# Patient Record
Sex: Male | Born: 1960 | Race: Black or African American | Hispanic: No | Marital: Married | Smoking: Current every day smoker
Health system: Southern US, Community
[De-identification: ages and names within clinical notes are randomized; demographics above are authoritative.]

## PROBLEM LIST (undated history)

## (undated) DIAGNOSIS — K759 Inflammatory liver disease, unspecified: Secondary | ICD-10-CM

## (undated) DIAGNOSIS — M545 Low back pain, unspecified: Secondary | ICD-10-CM

## (undated) DIAGNOSIS — E119 Type 2 diabetes mellitus without complications: Secondary | ICD-10-CM

## (undated) DIAGNOSIS — G709 Myoneural disorder, unspecified: Secondary | ICD-10-CM

## (undated) DIAGNOSIS — M1990 Unspecified osteoarthritis, unspecified site: Secondary | ICD-10-CM

## (undated) DIAGNOSIS — K219 Gastro-esophageal reflux disease without esophagitis: Secondary | ICD-10-CM

## (undated) DIAGNOSIS — F419 Anxiety disorder, unspecified: Secondary | ICD-10-CM

## (undated) DIAGNOSIS — F32A Depression, unspecified: Secondary | ICD-10-CM

## (undated) DIAGNOSIS — F1011 Alcohol abuse, in remission: Secondary | ICD-10-CM

## (undated) DIAGNOSIS — F329 Major depressive disorder, single episode, unspecified: Secondary | ICD-10-CM

## (undated) HISTORY — PX: DIAGNOSTIC LAPAROSCOPY: SUR761

## (undated) HISTORY — PX: HEPATITIS C VIRUS RNA BY BRANCHED DNA ASSAY: LAB15016

## (undated) HISTORY — PX: SKIN GRAFT: SHX250

## (undated) HISTORY — PX: LEG SURGERY: SHX1003

---

## 2004-10-06 ENCOUNTER — Emergency Department: Payer: Self-pay | Admitting: Emergency Medicine

## 2005-03-11 ENCOUNTER — Emergency Department: Payer: Self-pay | Admitting: Emergency Medicine

## 2005-04-10 ENCOUNTER — Emergency Department: Payer: Self-pay | Admitting: Emergency Medicine

## 2005-08-31 ENCOUNTER — Emergency Department: Payer: Self-pay | Admitting: Emergency Medicine

## 2005-09-01 ENCOUNTER — Emergency Department: Payer: Self-pay | Admitting: Emergency Medicine

## 2005-09-02 ENCOUNTER — Emergency Department: Payer: Self-pay | Admitting: Emergency Medicine

## 2005-11-23 ENCOUNTER — Emergency Department: Payer: Self-pay | Admitting: General Practice

## 2006-10-12 ENCOUNTER — Emergency Department: Payer: Self-pay | Admitting: General Practice

## 2006-10-24 ENCOUNTER — Emergency Department: Payer: Self-pay | Admitting: Emergency Medicine

## 2007-04-24 ENCOUNTER — Emergency Department: Payer: Self-pay | Admitting: General Practice

## 2008-04-10 ENCOUNTER — Emergency Department: Payer: Self-pay | Admitting: Internal Medicine

## 2008-05-17 ENCOUNTER — Emergency Department: Payer: Self-pay | Admitting: Emergency Medicine

## 2008-10-19 ENCOUNTER — Emergency Department: Payer: Self-pay | Admitting: Emergency Medicine

## 2009-01-13 ENCOUNTER — Emergency Department: Payer: Self-pay | Admitting: Emergency Medicine

## 2009-11-11 ENCOUNTER — Emergency Department: Payer: Self-pay | Admitting: Emergency Medicine

## 2010-11-20 ENCOUNTER — Emergency Department: Payer: Self-pay | Admitting: Emergency Medicine

## 2011-03-22 ENCOUNTER — Emergency Department: Payer: Self-pay | Admitting: Emergency Medicine

## 2013-01-01 ENCOUNTER — Emergency Department: Payer: Self-pay | Admitting: Emergency Medicine

## 2016-05-14 ENCOUNTER — Other Ambulatory Visit: Payer: Self-pay | Admitting: Unknown Physician Specialty

## 2016-05-14 DIAGNOSIS — B1920 Unspecified viral hepatitis C without hepatic coma: Secondary | ICD-10-CM

## 2016-05-15 ENCOUNTER — Ambulatory Visit
Admission: RE | Admit: 2016-05-15 | Discharge: 2016-05-15 | Disposition: A | Discharge status: Home / Self Care | Payer: Medicaid Other | Source: Ambulatory Visit | Attending: Unknown Physician Specialty | Admitting: Unknown Physician Specialty

## 2016-05-22 ENCOUNTER — Ambulatory Visit
Admission: RE | Admit: 2016-05-22 | Discharge: 2016-05-22 | Disposition: A | Discharge status: Home / Self Care | Payer: Medicaid Other | Source: Ambulatory Visit | Attending: Unknown Physician Specialty | Admitting: Unknown Physician Specialty

## 2016-05-22 DIAGNOSIS — R5381 Other malaise: Secondary | ICD-10-CM | POA: Diagnosis not present

## 2016-05-22 DIAGNOSIS — F1020 Alcohol dependence, uncomplicated: Secondary | ICD-10-CM | POA: Diagnosis not present

## 2016-05-22 DIAGNOSIS — Z1211 Encounter for screening for malignant neoplasm of colon: Secondary | ICD-10-CM | POA: Diagnosis not present

## 2016-05-22 DIAGNOSIS — R634 Abnormal weight loss: Secondary | ICD-10-CM | POA: Diagnosis not present

## 2016-05-22 DIAGNOSIS — B1920 Unspecified viral hepatitis C without hepatic coma: Secondary | ICD-10-CM | POA: Insufficient documentation

## 2016-05-22 DIAGNOSIS — R5383 Other fatigue: Secondary | ICD-10-CM | POA: Diagnosis not present

## 2016-06-12 ENCOUNTER — Emergency Department
Admission: EM | Admit: 2016-06-12 | Discharge: 2016-06-12 | Disposition: A | Discharge status: Home / Self Care | Payer: Medicaid Other | Attending: Emergency Medicine | Admitting: Emergency Medicine

## 2016-06-12 ENCOUNTER — Encounter: Payer: Self-pay | Admitting: Medical Oncology

## 2016-06-12 DIAGNOSIS — Y999 Unspecified external cause status: Secondary | ICD-10-CM | POA: Diagnosis not present

## 2016-06-12 DIAGNOSIS — Y939 Activity, unspecified: Secondary | ICD-10-CM | POA: Insufficient documentation

## 2016-06-12 DIAGNOSIS — S61259A Open bite of unspecified finger without damage to nail, initial encounter: Secondary | ICD-10-CM

## 2016-06-12 DIAGNOSIS — Y929 Unspecified place or not applicable: Secondary | ICD-10-CM | POA: Diagnosis not present

## 2016-06-12 DIAGNOSIS — W5581XA Bitten by other mammals, initial encounter: Secondary | ICD-10-CM | POA: Diagnosis not present

## 2016-06-12 DIAGNOSIS — Z23 Encounter for immunization: Secondary | ICD-10-CM | POA: Insufficient documentation

## 2016-06-12 DIAGNOSIS — S61151A Open bite of right thumb with damage to nail, initial encounter: Secondary | ICD-10-CM | POA: Diagnosis present

## 2016-06-12 MED ORDER — TETANUS-DIPHTH-ACELL PERTUSSIS 5-2.5-18.5 LF-MCG/0.5 IM SUSP
0.5000 mL | Freq: Once | INTRAMUSCULAR | Status: AC
  Administered 2016-06-12: 0.5 mL via INTRAMUSCULAR
  Filled 2016-06-12: qty 0.5

## 2016-06-12 MED ORDER — RABIES VACCINE, PCEC IM SUSR
1.0000 mL | Freq: Once | INTRAMUSCULAR | Status: AC
  Administered 2016-06-12: 1 mL via INTRAMUSCULAR
  Filled 2016-06-12: qty 1

## 2016-06-12 MED ORDER — RABIES IMMUNE GLOBULIN 150 UNIT/ML IM INJ
20.0000 [IU]/kg | INJECTION | Freq: Once | INTRAMUSCULAR | Status: AC
  Administered 2016-06-12: 1275 [IU] via INTRAMUSCULAR
  Filled 2016-06-12: qty 8

## 2016-06-12 NOTE — ED Provider Notes (Signed)
[facility] Emergency Department Provider Note   ____________________________________________   None    (approximate)  I have reviewed the triage vital signs and the nursing notes.   HISTORY  Chief Complaint Other (bit by bat)   HPI Miguel Rasmussen is a 55 y.o. male who presents today for evaluation of a bat bite which occurred yesterday. Patient states that he reached on top of his car when he felt something bite him and he instinctively grabbed the bat. Patient states that he stated bleeding at the base of his the nail bed of his right thumb. Patient cleaned the wound with alcohol, peroxide, and "squeezed all the blood out of it." Patient denies headache, confusion, memory loss, fever, or chills. He notes a burning sensation throughout his thumb but denies any ROM deficits. He notes 8/10 pain but has not taken any medications to alleviate the pain.   History reviewed. No pertinent past medical history.  There are no active problems to display for this patient.   History reviewed. No pertinent surgical history.  Prior to Admission medications   Not on File    Allergies Bactrim [sulfamethoxazole-trimethoprim] and Sulfa antibiotics  No family history on file.  Social History Social History  Substance Use Topics  . Smoking status: Not on file  . Smokeless tobacco: Not on file  . Alcohol use Not on file    Review of Systems Constitutional: No fever/chills Eyes: No visual changes. Cardiovascular: Denies chest pain. Respiratory: Denies shortness of breath. Gastrointestinal: No abdominal pain.  No nausea, no vomiting.  No diarrhea.  No constipation. Musculoskeletal: Negative for back pain. Denies decrease in strength or range of motion Skin: Negative for rash. Mild erythema to the base of the nail bed of right thumb Neurological: Negative for headaches, focal weakness or numbness. ____________________________________________   PHYSICAL  EXAM:  VITAL SIGNS: ED Triage Vitals  Enc Vitals Group     BP 06/12/16 1200 99/71     Pulse Rate 06/12/16 1200 82     Resp 06/12/16 1200 16     Temp 06/12/16 1200 98.6 F (37 C)     Temp Source 06/12/16 1200 Oral     SpO2 06/12/16 1200 98 %     Weight 06/12/16 1200 140 lb (63.5 kg)     Height 06/12/16 1200  (1.727 m)     Head Circumference --      Peak Flow --      Pain Score 06/12/16 1201 8     Pain Loc --      Pain Edu? --      Excl. in GC? --    Constitutional: Alert and oriented. Well appearing and in no acute distress. Eyes: Conjunctivae are normal. PERRL. Head: Atraumatic. Neck: No stridor.  Hematological/Lymphatic/Immunilogical: No cervical lymphadenopathy. Cardiovascular: Normal rate, regular rhythm. Grossly normal heart sounds. Good peripheral circulation. Respiratory: Normal respiratory effort.  No retractions. Lungs CTAB. Gastrointestinal: Soft and nontender. No distention. Musculoskeletal: No lower extremity tenderness nor edema.  No joint effusions. Mild pain to palpation throughout right thumb. No swelling noted. Neurologic:  Normal speech and language. No gross focal neurologic deficits are appreciated. No gait instability. Skin:  Skin is warm and dry. No rash noted. Mild redness to base of right thumb nail bed. No laceration noted Psychiatric: Mood and affect are normal. Speech and behavior are normal.  ____________________________________________   LABS (all labs ordered are listed, but only abnormal results are displayed)  Labs Reviewed - No  data to display ____________________________________________  EKG   ____________________________________________  RADIOLOGY   ____________________________________________   PROCEDURES  Procedure(s) performed: None  Procedures  Critical Care performed: No  ____________________________________________   INITIAL IMPRESSION / ASSESSMENT AND PLAN / ED COURSE  Pertinent labs & imaging results that  were available during my care of the patient were reviewed by me and considered in my medical decision making (see chart for details).    Clinical Course   Rabies series initiated at this visit. He was given strict return precautions. He is to return Friday for the second injection. He was advised that he should receive that at [facility] urgent care or return here. He was advised to return to the emergency department for symptoms of concern.  ____________________________________________   FINAL CLINICAL IMPRESSION(S) / ED DIAGNOSES  Final diagnoses:  Bat bite of finger, initial encounter      NEW MEDICATIONS STARTED DURING THIS VISIT:  New Prescriptions   No medications on file     Note:  This document was prepared using Dragon voice recognition software and may include unintentional dictation errors.    Chinita PesterCari B Nethaniel Mattie, FNP 06/12/16 1431    Governor Rooksebecca Lord, MD 06/12/16 [phone]

## 2016-06-12 NOTE — ED Triage Notes (Signed)
Pt reports he was bit by a bat to his rt thumb yesterday. Pt also reports rt sided lower back pain. Last tetanus was 5-6 years ago.

## 2016-06-12 NOTE — ED Notes (Signed)
See triage note  States he was bitten by bat yesterday to right thumb   States bat was on truck and he went to move the bat

## 2016-06-19 ENCOUNTER — Emergency Department
Admission: EM | Admit: 2016-06-19 | Discharge: 2016-06-19 | Disposition: A | Discharge status: Home / Self Care | Payer: Medicaid Other | Attending: Emergency Medicine | Admitting: Emergency Medicine

## 2016-06-19 DIAGNOSIS — Z23 Encounter for immunization: Secondary | ICD-10-CM

## 2016-06-19 DIAGNOSIS — F17200 Nicotine dependence, unspecified, uncomplicated: Secondary | ICD-10-CM | POA: Diagnosis not present

## 2016-06-19 DIAGNOSIS — Z203 Contact with and (suspected) exposure to rabies: Secondary | ICD-10-CM | POA: Insufficient documentation

## 2016-06-19 MED ORDER — RABIES VACCINE, PCEC IM SUSR
1.0000 mL | Freq: Once | INTRAMUSCULAR | Status: AC
  Administered 2016-06-19: 1 mL via INTRAMUSCULAR

## 2016-06-19 MED ORDER — RABIES VACCINE, PCEC IM SUSR
INTRAMUSCULAR | Status: AC
  Filled 2016-06-19: qty 1

## 2016-06-19 NOTE — Discharge Instructions (Signed)
Please return in 1 week and then the following week August 22 and 29th.

## 2016-06-19 NOTE — ED Provider Notes (Signed)
[facility] Emergency Department Provider Note  ____________________________________________  Time seen: Approximately 2:55 PM  I have reviewed the triage vital signs and the nursing notes.   HISTORY  Chief Complaint Rabies Injection    HPI Miguel Rasmussen is a [age] y.o. male who presents for evaluation of rabies vaccination. Patient states he is here for second and the series of vaccinations. He missed his scheduled due date and would like to pick up or he left off.   History reviewed. No pertinent past medical history.  There are no active problems to display for this patient.   History reviewed. No pertinent surgical history.  Prior to Admission medications   Not on File    Allergies Bactrim [sulfamethoxazole-trimethoprim] and Sulfa antibiotics  No family history on file.  Social History Social History  Substance Use Topics  . Smoking status: Current Every Day Smoker  . Smokeless tobacco: Never Used  . Alcohol use No    Review of Systems Constitutional: No fever/chills Eyes: No visual changes. ENT: No sore throat. Cardiovascular: Denies chest pain. Respiratory: Denies shortness of breath. Gastrointestinal: No abdominal pain.  No nausea, no vomiting.  No diarrhea.  No constipation. Genitourinary: Negative for dysuria. Musculoskeletal: Negative for back pain. Skin: Negative for rash. Neurological: Negative for headaches, focal weakness or numbness.  10-point ROS otherwise negative.  ____________________________________________   PHYSICAL EXAM:  VITAL SIGNS: ED Triage Vitals [06/19/16 1428]  Enc Vitals Group     BP 124/83     Pulse Rate 73     Resp 18     Temp 98.9 F (37.2 C)     Temp Source Oral     SpO2 96 %     Weight      Height 5\' 8"  (1.727 m)     Head Circumference      Peak Flow      Pain Score      Pain Loc      Pain Edu?      Excl. in GC?     Constitutional: Alert and oriented. Well appearing and in no  acute distress. Eyes: Conjunctivae are normal. PERRL. EOMI. Head: Atraumatic. Nose: No congestion/rhinnorhea. Mouth/Throat: Mucous membranes are moist.  Oropharynx non-erythematous. Neck: No stridor.   Cardiovascular: Normal rate, regular rhythm. Grossly normal heart sounds.  Good peripheral circulation. Respiratory: Normal respiratory effort.  No retractions. Lungs CTAB. Musculoskeletal: No lower extremity tenderness nor edema.  No joint effusions. Neurologic:  Normal speech and language. No gross focal neurologic deficits are appreciated. No gait instability. Skin:  Skin is warm, dry and intact. No rash noted. Psychiatric: Mood and affect are normal. Speech and behavior are normal.  ____________________________________________   LABS (all labs ordered are listed, but only abnormal results are displayed)  Labs Reviewed - No data to display ____________________________________________  EKG   ____________________________________________  RADIOLOGY   ____________________________________________   PROCEDURES  Procedure(s) performed: None  Critical Care performed: No  ____________________________________________   INITIAL IMPRESSION / ASSESSMENT AND PLAN / ED COURSE  Pertinent labs & imaging results that were available during my care of the patient were reviewed by me and considered in my medical decision making (see chart for details). Review of the [place] CSRS was performed in accordance of the [facility] prior to dispensing any controlled drugs.  His vaccination section injection. Patient follow-up on days #14 and 21 now.  Clinical Course    ____________________________________________   FINAL CLINICAL IMPRESSION(S) / ED DIAGNOSES  Final diagnoses:  Need  for rabies vaccination     This chart was dictated using voice recognition software/Dragon. Despite best efforts to proofread, errors can occur which can change the meaning. Any change was purely unintentional.     Evangeline Dakinharles M Shariece Viveiros, PA-C 06/19/16 1500    Governor Rooksebecca Lord, MD 06/19/16 [phone]

## 2016-06-19 NOTE — ED Triage Notes (Signed)
Pt is here for second rabies injection, states he thinks he was suppose to be here Friday but did not have a ride.

## 2016-06-26 ENCOUNTER — Emergency Department
Admission: EM | Admit: 2016-06-26 | Discharge: 2016-06-26 | Disposition: A | Discharge status: Home / Self Care | Payer: Medicaid Other | Attending: Emergency Medicine | Admitting: Emergency Medicine

## 2016-06-26 ENCOUNTER — Encounter: Payer: Self-pay | Admitting: Emergency Medicine

## 2016-06-26 DIAGNOSIS — Z203 Contact with and (suspected) exposure to rabies: Secondary | ICD-10-CM | POA: Insufficient documentation

## 2016-06-26 DIAGNOSIS — F17200 Nicotine dependence, unspecified, uncomplicated: Secondary | ICD-10-CM | POA: Insufficient documentation

## 2016-06-26 DIAGNOSIS — Z23 Encounter for immunization: Secondary | ICD-10-CM

## 2016-06-26 MED ORDER — RABIES VACCINE, PCEC IM SUSR
1.0000 mL | Freq: Once | INTRAMUSCULAR | Status: AC
  Administered 2016-06-26: 1 mL via INTRAMUSCULAR
  Filled 2016-06-26: qty 1

## 2016-06-26 NOTE — ED Triage Notes (Signed)
Pt here for third rabies injection.  

## 2016-06-26 NOTE — ED Notes (Signed)
Here for additional rabies vaccine in series

## 2016-06-26 NOTE — ED Provider Notes (Signed)
[facility] Emergency Department Provider Note  ____________________________________________  Time seen: Approximately 4:08 PM  I have reviewed the triage vital signs and the nursing notes.   HISTORY  Chief Complaint Rabies Injection    HPI Miguel Rasmussen is a 55 y.o. male presents for evaluation of rabies vaccination. Patient denies any complaints since previous injection. Voices no other emergency complaints   History reviewed. No pertinent past medical history.  There are no active problems to display for this patient.   No past surgical history on file.  Prior to Admission medications   Not on File    Allergies Bactrim [sulfamethoxazole-trimethoprim] and Sulfa antibiotics  No family history on file.  Social History Social History  Substance Use Topics  . Smoking status: Current Every Day Smoker  . Smokeless tobacco: Never Used  . Alcohol use No    Review of Systems Constitutional: No fever/chills Eyes: No visual changes. ENT: No sore throat. Cardiovascular: Denies chest pain. Respiratory: Denies shortness of breath. Gastrointestinal: No abdominal pain.  No nausea, no vomiting.  No diarrhea.  No constipation. Genitourinary: Negative for dysuria. Musculoskeletal: Negative for back pain. Skin: Negative for rash. Neurological: Negative for headaches, focal weakness or numbness.  10-point ROS otherwise negative.  ____________________________________________   PHYSICAL EXAM:  VITAL SIGNS: ED Triage Vitals [06/26/16 1517]  Enc Vitals Group     BP 138/73     Pulse Rate 71     Resp 16     Temp 98.5 F (36.9 C)     Temp Source Oral     SpO2 99 %     Weight 145 lb (65.8 kg)     Height 5\' 8"  (1.727 m)     Head Circumference      Peak Flow      Pain Score      Pain Loc      Pain Edu?      Excl. in GC?     Constitutional: Alert and oriented. Well appearing and in no acute distress.  Cardiovascular: Normal rate, regular  rhythm. Grossly normal heart sounds.  Good peripheral circulation. Respiratory: Normal respiratory effort.  No retractions. Lungs CTAB. Neurologic:  Normal speech and language. No gross focal neurologic deficits are appreciated. No gait instability. Skin:  Skin is warm, dry and intact. No rash noted. Psychiatric: Mood and affect are normal. Speech and behavior are normal.  ____________________________________________   LABS (all labs ordered are listed, but only abnormal results are displayed)  Labs Reviewed - No data to display ____________________________________________  EKG   ____________________________________________  RADIOLOGY   ____________________________________________   PROCEDURES  Procedure(s) performed: None  Critical Care performed: No  ____________________________________________   INITIAL IMPRESSION / ASSESSMENT AND PLAN / ED COURSE  Pertinent labs & imaging results that were available during my care of the patient were reviewed by me and considered in my medical decision making (see chart for details). Review of the [place] CSRS was performed in accordance of the [facility] prior to dispensing any controlled drugs.  Rabies vaccination. Patient return in 1 week for final and the series. He voices no other emergency medical complaints at this time.  Clinical Course    ____________________________________________   FINAL CLINICAL IMPRESSION(S) / ED DIAGNOSES  Final diagnoses:  Need for rabies vaccination     This chart was dictated using voice recognition software/Dragon. Despite best efforts to proofread, errors can occur which can change the meaning. Any change was purely unintentional.    Charmayne Sheerharles M  Beers, PA-C 06/26/16 1613    Loleta Roseory Forbach, MD 06/26/16 [phone]

## 2016-06-29 ENCOUNTER — Encounter: Payer: Self-pay | Admitting: *Deleted

## 2016-07-02 ENCOUNTER — Ambulatory Visit
Admission: RE | Admit: 2016-07-02 | Discharge: 2016-07-02 | Disposition: A | Discharge status: Home / Self Care | Payer: Medicaid Other | Source: Ambulatory Visit | Attending: Unknown Physician Specialty | Admitting: Unknown Physician Specialty

## 2016-07-02 ENCOUNTER — Encounter
Admission: RE | Disposition: A | Discharge status: Home / Self Care | Payer: Self-pay | Source: Ambulatory Visit | Attending: Unknown Physician Specialty

## 2016-07-02 HISTORY — DX: Inflammatory liver disease, unspecified: K75.9

## 2016-07-02 SURGERY — COLONOSCOPY WITH PROPOFOL
Anesthesia: General

## 2016-07-03 ENCOUNTER — Encounter: Payer: Self-pay | Admitting: Emergency Medicine

## 2016-07-03 ENCOUNTER — Emergency Department
Admission: EM | Admit: 2016-07-03 | Discharge: 2016-07-03 | Discharge status: Against Medical Advice | Payer: Medicaid Other | Attending: Student | Admitting: Student

## 2016-07-03 DIAGNOSIS — F17210 Nicotine dependence, cigarettes, uncomplicated: Secondary | ICD-10-CM | POA: Diagnosis not present

## 2016-07-03 DIAGNOSIS — Z23 Encounter for immunization: Secondary | ICD-10-CM

## 2016-07-03 MED ORDER — RABIES VACCINE, PCEC IM SUSR
1.0000 mL | Freq: Once | INTRAMUSCULAR | Status: AC
  Administered 2016-07-03: 1 mL via INTRAMUSCULAR
  Filled 2016-07-03: qty 1

## 2016-07-03 NOTE — ED Provider Notes (Signed)
[facility] Emergency Department Provider Note  ____________________________________________  Time seen: Approximately 11:39 AM  I have reviewed the triage vital signs and the nursing notes.   HISTORY  Chief Complaint Rabies Injection    HPI Miguel Rasmussen is a 55 y.o. male , NAD, presents to emergency department for fourth and final rabies vaccination. Has had no adverse events with previous vaccinations and has no other complaints to be evaluated.   Past Medical History:  Diagnosis Date  . Hepatitis    hep c without hepatic coma    There are no active problems to display for this patient.   History reviewed. No pertinent surgical history.  Prior to Admission medications   Medication Sig Start Date End Date Taking? Authorizing Provider  Ledipasvir-Sofosbuvir (HARVONI) 90-400 MG TABS Take by mouth.    Historical Provider, MD    Allergies Bactrim [sulfamethoxazole-trimethoprim] and Sulfa antibiotics  No family history on file.  Social History Social History  Substance Use Topics  . Smoking status: Current Every Day Smoker    Types: Cigarettes  . Smokeless tobacco: Never Used  . Alcohol use Yes     Comment: every day      Review of Systems  Patient left emergency department before full ROS could be completed. Patient was visualized without distress, in no pain with a steady gait.  ____________________________________________   PHYSICAL EXAM:  VITAL SIGNS: ED Triage Vitals [07/03/16 1051]  Enc Vitals Group     BP 133/81     Pulse Rate 73     Resp 18     Temp 98.4 F (36.9 C)     Temp Source Oral     SpO2 99 %     Weight      Height      Head Circumference      Peak Flow      Pain Score 7     Pain Loc      Pain Edu?      Excl. in GC?      Constitutional: Alert and oriented. Well appearing and in no acute distress. Head: Atraumatic. Respiratory: Normal respiratory effort without tachypnea or retractions.  Neurologic:   Normal speech and language. No gross focal neurologic deficits are appreciated. Gait and posture are normal Skin:  Skin is warm, dry and intact. No rash noted. Psychiatric: Mood and affect are normal. Speech and behavior are normal. Patient exhibits appropriate insight and judgement.   ____________________________________________   LABS  None ____________________________________________  EKG  None ____________________________________________  RADIOLOGY  None ____________________________________________    PROCEDURES  Procedure(s) performed: None   Procedures   Medications  rabies vaccine (RABAVERT) injection 1 mL (1 mL Intramuscular Given 07/03/16 1119)     ____________________________________________   INITIAL IMPRESSION / ASSESSMENT AND PLAN / ED COURSE  Pertinent labs & imaging results that were available during my care of the patient were reviewed by me and considered in my medical decision making (see chart for details).  Clinical Course    Patient's diagnosis is consistent with need for rabies vaccination. Patient left the emergency department before complete review of systems and physical examination could be done. Patient stated "I am on someone else's time" as he walked down the hallway towards the exit. Patient was seen ambulating and leaving the department without distress or difficulty. He was in no pain and tolerated RabAvert injection well without immediate side effects.     ____________________________________________  FINAL CLINICAL IMPRESSION(S) / ED DIAGNOSES  Final diagnoses:  Need for rabies vaccination      NEW MEDICATIONS STARTED DURING THIS VISIT:  New Prescriptions   No medications on file         Hope PigeonJami L Adaleena Mooers, PA-C 07/03/16 1144    Gayla DossEryka A Gayle, MD 07/03/16 [phone]

## 2016-07-03 NOTE — ED Triage Notes (Signed)
Pt to ed for last rabies vaccine.   

## 2016-07-03 NOTE — ED Notes (Addendum)
Pt here for rabies injection, has already had 6 injections previous to today. Bite from bat was 3wks ago. Pt denies any symptoms

## 2016-09-14 ENCOUNTER — Encounter: Payer: Self-pay | Admitting: *Deleted

## 2016-09-17 ENCOUNTER — Ambulatory Visit: Payer: Medicaid Other | Admitting: Anesthesiology

## 2016-09-17 ENCOUNTER — Encounter: Payer: Self-pay | Admitting: Anesthesiology

## 2016-09-17 ENCOUNTER — Ambulatory Visit
Admission: RE | Admit: 2016-09-17 | Discharge: 2016-09-17 | Disposition: A | Discharge status: Home / Self Care | Payer: Medicaid Other | Source: Ambulatory Visit | Attending: Unknown Physician Specialty | Admitting: Unknown Physician Specialty

## 2016-09-17 ENCOUNTER — Encounter
Admission: RE | Disposition: A | Discharge status: Home / Self Care | Payer: Self-pay | Source: Ambulatory Visit | Attending: Unknown Physician Specialty

## 2016-09-17 DIAGNOSIS — F17210 Nicotine dependence, cigarettes, uncomplicated: Secondary | ICD-10-CM | POA: Insufficient documentation

## 2016-09-17 DIAGNOSIS — Z1211 Encounter for screening for malignant neoplasm of colon: Secondary | ICD-10-CM | POA: Diagnosis present

## 2016-09-17 DIAGNOSIS — K621 Rectal polyp: Secondary | ICD-10-CM | POA: Insufficient documentation

## 2016-09-17 DIAGNOSIS — E119 Type 2 diabetes mellitus without complications: Secondary | ICD-10-CM | POA: Diagnosis not present

## 2016-09-17 DIAGNOSIS — R634 Abnormal weight loss: Secondary | ICD-10-CM | POA: Diagnosis not present

## 2016-09-17 DIAGNOSIS — K3189 Other diseases of stomach and duodenum: Secondary | ICD-10-CM | POA: Diagnosis not present

## 2016-09-17 DIAGNOSIS — F329 Major depressive disorder, single episode, unspecified: Secondary | ICD-10-CM | POA: Insufficient documentation

## 2016-09-17 DIAGNOSIS — F419 Anxiety disorder, unspecified: Secondary | ICD-10-CM | POA: Diagnosis not present

## 2016-09-17 DIAGNOSIS — K640 First degree hemorrhoids: Secondary | ICD-10-CM | POA: Insufficient documentation

## 2016-09-17 DIAGNOSIS — B1920 Unspecified viral hepatitis C without hepatic coma: Secondary | ICD-10-CM | POA: Diagnosis not present

## 2016-09-17 DIAGNOSIS — Z882 Allergy status to sulfonamides status: Secondary | ICD-10-CM | POA: Insufficient documentation

## 2016-09-17 HISTORY — PX: COLONOSCOPY: SHX5424

## 2016-09-17 HISTORY — DX: Major depressive disorder, single episode, unspecified: F32.9

## 2016-09-17 HISTORY — DX: Depression, unspecified: F32.A

## 2016-09-17 HISTORY — DX: Type 2 diabetes mellitus without complications: E11.9

## 2016-09-17 HISTORY — DX: Anxiety disorder, unspecified: F41.9

## 2016-09-17 HISTORY — DX: Low back pain, unspecified: M54.50

## 2016-09-17 HISTORY — DX: Low back pain: M54.5

## 2016-09-17 HISTORY — DX: Alcohol abuse, in remission: F10.11

## 2016-09-17 HISTORY — PX: ESOPHAGOGASTRODUODENOSCOPY (EGD) WITH PROPOFOL: SHX5813

## 2016-09-17 LAB — PROTIME-INR
INR: 0.93
Prothrombin Time: 12.5 seconds (ref 11.4–15.2)

## 2016-09-17 LAB — CBC WITH DIFFERENTIAL/PLATELET
BASOS ABS: 0 10*3/uL (ref 0–0.1)
BASOS PCT: 1 %
EOS PCT: 1 %
Eosinophils Absolute: 0.1 10*3/uL (ref 0–0.7)
HCT: 45.6 % (ref 40.0–52.0)
Hemoglobin: 15.1 g/dL (ref 13.0–18.0)
LYMPHS PCT: 28 %
Lymphs Abs: 2 10*3/uL (ref 1.0–3.6)
MCH: 33.1 pg (ref 26.0–34.0)
MCHC: 33.1 g/dL (ref 32.0–36.0)
MCV: 100 fL (ref 80.0–100.0)
MONO ABS: 0.8 10*3/uL (ref 0.2–1.0)
Monocytes Relative: 11 %
Neutro Abs: 4.2 10*3/uL (ref 1.4–6.5)
Neutrophils Relative %: 59 %
PLATELETS: 124 10*3/uL — AB (ref 150–440)
RBC: 4.56 MIL/uL (ref 4.40–5.90)
RDW: 12.3 % (ref 11.5–14.5)
WBC: 7 10*3/uL (ref 3.8–10.6)

## 2016-09-17 SURGERY — ESOPHAGOGASTRODUODENOSCOPY (EGD) WITH PROPOFOL
Anesthesia: General

## 2016-09-17 MED ORDER — SODIUM CHLORIDE 0.9 % IV SOLN
INTRAVENOUS | Status: DC
  Administered 2016-09-17: 11:00:00 via INTRAVENOUS

## 2016-09-17 MED ORDER — GLYCOPYRROLATE 0.2 MG/ML IJ SOLN
INTRAMUSCULAR | Status: DC | PRN
  Administered 2016-09-17: 0.2 mg via INTRAVENOUS

## 2016-09-17 MED ORDER — LIDOCAINE HCL (PF) 2 % IJ SOLN
INTRAMUSCULAR | Status: DC | PRN
Start: 2016-09-17 — End: 2016-09-17
  Administered 2016-09-17: 50 mg

## 2016-09-17 MED ORDER — PROPOFOL 10 MG/ML IV BOLUS
INTRAVENOUS | Status: DC | PRN
  Administered 2016-09-17: 50 mg via INTRAVENOUS

## 2016-09-17 MED ORDER — FENTANYL CITRATE (PF) 100 MCG/2ML IJ SOLN
INTRAMUSCULAR | Status: DC | PRN
  Administered 2016-09-17: 50 ug via INTRAVENOUS

## 2016-09-17 MED ORDER — MIDAZOLAM HCL 5 MG/5ML IJ SOLN
INTRAMUSCULAR | Status: DC | PRN
  Administered 2016-09-17: 2 mg via INTRAVENOUS

## 2016-09-17 MED ORDER — PROPOFOL 500 MG/50ML IV EMUL
INTRAVENOUS | Status: DC | PRN
  Administered 2016-09-17: 100 ug/kg/min via INTRAVENOUS

## 2016-09-17 NOTE — Anesthesia Postprocedure Evaluation (Signed)
Anesthesia Post Note  Patient: Lilyan Puntlvin P Dowland  Procedure(s) Performed: Procedure(s) (LRB): ESOPHAGOGASTRODUODENOSCOPY (EGD) WITH PROPOFOL (N/A) COLONOSCOPY (N/A)  Patient location during evaluation: Endoscopy Anesthesia Type: General Level of consciousness: awake and alert Pain management: pain level controlled Vital Signs Assessment: post-procedure vital signs reviewed and stable Respiratory status: spontaneous breathing, nonlabored ventilation, respiratory function stable and patient connected to nasal cannula oxygen Cardiovascular status: blood pressure returned to baseline and stable Postop Assessment: no signs of nausea or vomiting Anesthetic complications: no    Last Vitals:  Vitals:   09/17/16 1110 09/17/16 1140  BP: 119/88 (!) 152/80  Pulse: 85   Resp: 18   Temp: 36.1 C     Last Pain:  Vitals:   09/17/16 1110  TempSrc: Tympanic                 Tavien Chestnut S

## 2016-09-17 NOTE — Anesthesia Preprocedure Evaluation (Addendum)
Anesthesia Evaluation  Patient identified by MRN, date of birth, ID band Patient awake    Reviewed: Allergy & Precautions, NPO status , Patient's Chart, lab work & pertinent test results, reviewed documented beta blocker date and time   Airway Mallampati: II  TM Distance: >3 FB     Dental  (+) Chipped   Pulmonary Current Smoker,           Cardiovascular      Neuro/Psych PSYCHIATRIC DISORDERS Anxiety Depression    GI/Hepatic (+) Hepatitis -, C  Endo/Other  diabetes, Type 2  Renal/GU      Musculoskeletal   Abdominal   Peds  Hematology   Anesthesia Other Findings ETOH. Will start taking Harvoni in Dec.  Reproductive/Obstetrics                            Anesthesia Physical Anesthesia Plan  ASA: III  Anesthesia Plan: General   Post-op Pain Management:    Induction: Intravenous  Airway Management Planned: Nasal Cannula  Additional Equipment:   Intra-op Plan:   Post-operative Plan:   Informed Consent: I have reviewed the patients History and Physical, chart, labs and discussed the procedure including the risks, benefits and alternatives for the proposed anesthesia with the patient or authorized representative who has indicated his/her understanding and acceptance.     Plan Discussed with: CRNA  Anesthesia Plan Comments:         Anesthesia Quick Evaluation

## 2016-09-17 NOTE — Op Note (Signed)
[facility] Gastroenterology Patient Name: Candie Milelvin Counihan Procedure Date: 09/17/2016 10:33 AM MRN: [id number] Account #: [id number] Date of Birth: 1961-05-09 Admit Type: Outpatient Age: 55 Room: [facility] ENDO ROOM 4 Gender: Male Note Status: Finalized Procedure:            Upper GI endoscopy Indications:          Weight loss Providers:            Scot Junobert T. Lucill Mauck, MD Referring MD:         Oswaldo DoneAbby D. Hessie DienerBender MD, MD (Referring MD) Medicines:            Propofol per Anesthesia Complications:        No immediate complications. Procedure:            Pre-Anesthesia Assessment:                       - After reviewing the risks and benefits, the patient                        was deemed in satisfactory condition to undergo the                        procedure.                       After obtaining informed consent, the endoscope was                        passed under direct vision. Throughout the procedure,                        the patient's blood pressure, pulse, and oxygen                        saturations were monitored continuously. The Endoscope                        was introduced through the mouth, and advanced to the                        second part of duodenum. The upper GI endoscopy was                        accomplished without difficulty. The patient tolerated                        the procedure well. Findings:      The examined esophagus was normal. GEJ 44cm.      Prominent gastric folds were found in the prepyloric region of the       stomach. Biopsies were taken with a cold forceps for histology.      Diffuse minimal inflammation characterized by granularity was found in       the gastric body. Biopsies were taken with a cold forceps for histology.      The examined duodenum was normal. Impression:           - Normal esophagus.                       - Enlarged gastric folds. Biopsied.                       -  Gastritis. Biopsied.                       -  Normal examined duodenum. Recommendation:       - Await pathology results.                       - Perform a colonoscopy as previously scheduled. Scot Junobert T Jaleen Finch, MD 09/17/2016 10:51:07 AM This report has been signed electronically. Number of Addenda: 0 Note Initiated On: 09/17/2016 10:33 AM      [facility]
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
## 2016-09-17 NOTE — Op Note (Signed)
[facility] Gastroenterology Patient Name: Miguel Rasmussen Procedure Date: 09/17/2016 10:33 AM MRN: [id number] Account #: [id number] Date of Birth: 30-Apr-1961 Admit Type: Outpatient Age: [age] [facility] ENDO ROOM 4 Gender: Male Note Status: Finalized Procedure:            Colonoscopy Indications:          Screening for colorectal malignant neoplasm Providers:            Scot Junobert T. Elliott, MD Referring MD:         Oswaldo DoneAbby D. Hessie DienerBender MD, MD (Referring MD) Medicines:            Propofol per Anesthesia Complications:        No immediate complications. Procedure:            Pre-Anesthesia Assessment:                       - After reviewing the risks and benefits, the patient                        was deemed in satisfactory condition to undergo the                        procedure.                       After obtaining informed consent, the colonoscope was                        passed under direct vision. Throughout the procedure,                        the patient's blood pressure, pulse, and oxygen                        saturations were monitored continuously. The                        Colonoscope was introduced through the anus and                        advanced to the the cecum, identified by appendiceal                        orifice and ileocecal valve. The colonoscopy was                        performed without difficulty. The patient tolerated the                        procedure well. The quality of the bowel preparation                        was adequate to identify polyps. Findings:      A diminutive polyp was found in the rectum. The polyp was sessile. The       polyp was removed with a jumbo cold forceps. Resection and retrieval       were complete.      Internal hemorrhoids were found during endoscopy. The hemorrhoids were       small and Grade I (internal hemorrhoids that do not prolapse).  The exam was otherwise without abnormality. Impression:            - One diminutive polyp in the rectum, removed with a                        jumbo cold forceps. Resected and retrieved.                       - Internal hemorrhoids.                       - The examination was otherwise normal. Recommendation:       - Await pathology results. Scot Junobert T Elliott, MD 09/17/2016 11:07:03 AM This report has been signed electronically. Number of Addenda: 0 Note Initiated On: 09/17/2016 10:33 AM Scope Withdrawal Time: 0 hours 7 minutes 20 seconds  Total Procedure Duration: 0 hours 12 minutes 38 seconds       [facility]

## 2016-09-17 NOTE — Transfer of Care (Signed)
Immediate Anesthesia Transfer of Care Note  Patient: Miguel Rasmussen  Procedure(s) Performed: Procedure(s): ESOPHAGOGASTRODUODENOSCOPY (EGD) WITH PROPOFOL (N/A) COLONOSCOPY (N/A)  Patient Location: PACU  Anesthesia Type:General  Level of Consciousness: sedated  Airway & Oxygen Therapy: Patient Spontanous Breathing and Patient connected to nasal cannula oxygen  Post-op Assessment: Report given to RN and Post -op Vital signs reviewed and stable  Post vital signs: Reviewed and stable  Last Vitals:  Vitals:   09/17/16 0938 09/17/16 1110  BP: (!) 108/58 119/88  Pulse: 85 85  Resp: 18 18  Temp: 36.5 C 36.1 C    Last Pain:  Vitals:   09/17/16 1110  TempSrc: Tympanic         Complications: No apparent anesthesia complications

## 2016-09-18 ENCOUNTER — Encounter: Payer: Self-pay | Admitting: Unknown Physician Specialty

## 2016-09-19 LAB — SURGICAL PATHOLOGY

## 2017-01-24 ENCOUNTER — Other Ambulatory Visit: Payer: Self-pay | Admitting: Gastroenterology

## 2017-01-24 DIAGNOSIS — B182 Chronic viral hepatitis C: Secondary | ICD-10-CM

## 2017-01-31 ENCOUNTER — Ambulatory Visit: Payer: Medicaid Other

## 2017-02-01 ENCOUNTER — Ambulatory Visit
Admission: RE | Admit: 2017-02-01 | Discharge: 2017-02-01 | Disposition: A | Discharge status: Home / Self Care | Payer: Medicaid Other | Source: Ambulatory Visit | Attending: Gastroenterology | Admitting: Gastroenterology

## 2017-02-01 DIAGNOSIS — B182 Chronic viral hepatitis C: Secondary | ICD-10-CM | POA: Insufficient documentation

## 2017-02-01 DIAGNOSIS — R932 Abnormal findings on diagnostic imaging of liver and biliary tract: Secondary | ICD-10-CM | POA: Insufficient documentation

## 2017-02-01 DIAGNOSIS — K7460 Unspecified cirrhosis of liver: Secondary | ICD-10-CM | POA: Insufficient documentation

## 2017-06-28 ENCOUNTER — Other Ambulatory Visit: Payer: Self-pay | Admitting: Gastroenterology

## 2017-06-28 DIAGNOSIS — B182 Chronic viral hepatitis C: Secondary | ICD-10-CM

## 2017-07-11 ENCOUNTER — Emergency Department
Admission: EM | Admit: 2017-07-11 | Discharge: 2017-07-11 | Disposition: A | Discharge status: Home / Self Care | Payer: Medicaid Other | Attending: Emergency Medicine | Admitting: Emergency Medicine

## 2017-07-11 ENCOUNTER — Encounter: Payer: Self-pay | Admitting: Emergency Medicine

## 2017-07-11 ENCOUNTER — Emergency Department: Payer: Medicaid Other

## 2017-07-11 DIAGNOSIS — L0211 Cutaneous abscess of neck: Secondary | ICD-10-CM | POA: Insufficient documentation

## 2017-07-11 DIAGNOSIS — F17210 Nicotine dependence, cigarettes, uncomplicated: Secondary | ICD-10-CM | POA: Insufficient documentation

## 2017-07-11 DIAGNOSIS — R221 Localized swelling, mass and lump, neck: Secondary | ICD-10-CM | POA: Insufficient documentation

## 2017-07-11 DIAGNOSIS — L0291 Cutaneous abscess, unspecified: Secondary | ICD-10-CM

## 2017-07-11 DIAGNOSIS — Z8619 Personal history of other infectious and parasitic diseases: Secondary | ICD-10-CM | POA: Diagnosis not present

## 2017-07-11 DIAGNOSIS — R6884 Jaw pain: Secondary | ICD-10-CM | POA: Diagnosis present

## 2017-07-11 DIAGNOSIS — E119 Type 2 diabetes mellitus without complications: Secondary | ICD-10-CM | POA: Insufficient documentation

## 2017-07-11 LAB — COMPREHENSIVE METABOLIC PANEL
ALBUMIN: 4 g/dL (ref 3.5–5.0)
ALT: 50 U/L (ref 17–63)
ANION GAP: 9 (ref 5–15)
AST: 74 U/L — ABNORMAL HIGH (ref 15–41)
Alkaline Phosphatase: 62 U/L (ref 38–126)
BUN: 13 mg/dL (ref 6–20)
CHLORIDE: 101 mmol/L (ref 101–111)
CO2: 28 mmol/L (ref 22–32)
Calcium: 9.4 mg/dL (ref 8.9–10.3)
Creatinine, Ser: 1.02 mg/dL (ref 0.61–1.24)
GFR calc Af Amer: >60 mL/min (ref >60)
GLUCOSE: 149 mg/dL — AB (ref 65–99)
POTASSIUM: 3.5 mmol/L (ref 3.5–5.1)
SODIUM: 138 mmol/L (ref 135–145)
Total Bilirubin: 0.8 mg/dL (ref 0.3–1.2)
Total Protein: 7.6 g/dL (ref 6.5–8.1)

## 2017-07-11 LAB — CBC WITH DIFFERENTIAL/PLATELET
BASOS ABS: 0 10*3/uL (ref 0–0.1)
Basophils Relative: 1 %
EOS PCT: 0 %
Eosinophils Absolute: 0 10*3/uL (ref 0–0.7)
HEMATOCRIT: 43.1 % (ref 40.0–52.0)
Hemoglobin: 14.5 g/dL (ref 13.0–18.0)
LYMPHS ABS: 1.9 10*3/uL (ref 1.0–3.6)
LYMPHS PCT: 23 %
MCH: 33.4 pg (ref 26.0–34.0)
MCHC: 33.7 g/dL (ref 32.0–36.0)
MCV: 99.2 fL (ref 80.0–100.0)
Monocytes Absolute: 0.7 10*3/uL (ref 0.2–1.0)
Monocytes Relative: 8 %
NEUTROS ABS: 5.7 10*3/uL (ref 1.4–6.5)
Neutrophils Relative %: 68 %
PLATELETS: 131 10*3/uL — AB (ref 150–440)
RBC: 4.34 MIL/uL — AB (ref 4.40–5.90)
RDW: 12.2 % (ref 11.5–14.5)
WBC: 8.3 10*3/uL (ref 3.8–10.6)

## 2017-07-11 MED ORDER — IOPAMIDOL (ISOVUE-300) INJECTION 61%
75.0000 mL | Freq: Once | INTRAVENOUS | Status: AC | PRN
  Administered 2017-07-11: 75 mL via INTRAVENOUS
  Filled 2017-07-11: qty 75

## 2017-07-11 MED ORDER — CLINDAMYCIN HCL 300 MG PO CAPS: 300.0000 mg | ORAL_CAPSULE | Freq: Three times a day (TID) | ORAL | 0 refills | Status: AC

## 2017-07-11 MED ORDER — LIDOCAINE HCL (PF) 1 % IJ SOLN
INTRAMUSCULAR | Status: AC
  Administered 2017-07-11: 5 mL
  Filled 2017-07-11: qty 5

## 2017-07-11 MED ORDER — CLINDAMYCIN HCL 150 MG PO CAPS
ORAL_CAPSULE | ORAL | Status: AC
  Administered 2017-07-11: 150 mg
  Filled 2017-07-11: qty 1

## 2017-07-11 MED ORDER — HYDROCODONE-ACETAMINOPHEN 5-325 MG PO TABS: 1.0000 | ORAL_TABLET | ORAL | 0 refills | Status: AC | PRN

## 2017-07-11 NOTE — ED Triage Notes (Signed)
Patient presents to ED via POV from urgent care. Patient was recently put on amoxicillin for what was thought to be an infected lymph node. Patient has finished his antibiotic but the pain remains. Large swollen area noted below left mandible. Patient states he is able to swallow but it is very painful. Patient also reports a loss of 20 pounds in the past month. Tonsils are red but not enlarged at this time.

## 2017-07-11 NOTE — ED Notes (Addendum)
See triage note  Presents with family  States he developed a sore throat about 2 weeks ago  Was placed on antibiotics   States pain was increased with some increased swelling to left side of neck  Throat is red at present  No pus noted  Afebrile on arrival
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
## 2017-07-11 NOTE — Discharge Instructions (Signed)
as we discussed please call the number for ENT today to arrange a follow-up appointment as soon as possible. Please take her clindamycin as prescribed. Return to the emergency department for any increase in swelling, any difficulty breathing or swallowing,fever, or any other symptom personally concerning to yourself.

## 2017-07-19 ENCOUNTER — Ambulatory Visit
Admission: RE | Admit: 2017-07-19 | Discharge: 2017-07-19 | Disposition: A | Discharge status: Home / Self Care | Payer: Medicaid Other | Source: Ambulatory Visit | Attending: Gastroenterology | Admitting: Gastroenterology

## 2017-07-19 DIAGNOSIS — K760 Fatty (change of) liver, not elsewhere classified: Secondary | ICD-10-CM | POA: Diagnosis not present

## 2017-07-19 DIAGNOSIS — B182 Chronic viral hepatitis C: Secondary | ICD-10-CM | POA: Insufficient documentation

## 2017-07-29 ENCOUNTER — Other Ambulatory Visit: Payer: Self-pay | Admitting: Otolaryngology

## 2017-07-29 DIAGNOSIS — R590 Localized enlarged lymph nodes: Secondary | ICD-10-CM

## 2017-07-30 NOTE — OR Nursing (Signed)
Attempted on 9/25 @ 1640 to call patient regarding biopsy scheduled for Friday, not able to reach secondarily to invalid number as well as backup number not allowing message to be left for patient to call back for instructions regarding upcoming procedure.

## 2017-07-31 ENCOUNTER — Other Ambulatory Visit: Payer: Self-pay | Admitting: Radiology

## 2017-08-02 ENCOUNTER — Ambulatory Visit
Admission: RE | Admit: 2017-08-02 | Discharge: 2017-08-02 | Disposition: A | Discharge status: Home / Self Care | Payer: Medicaid Other | Source: Ambulatory Visit | Attending: Otolaryngology | Admitting: Otolaryngology

## 2017-08-02 DIAGNOSIS — M545 Low back pain: Secondary | ICD-10-CM | POA: Insufficient documentation

## 2017-08-02 DIAGNOSIS — Z882 Allergy status to sulfonamides status: Secondary | ICD-10-CM | POA: Insufficient documentation

## 2017-08-02 DIAGNOSIS — E119 Type 2 diabetes mellitus without complications: Secondary | ICD-10-CM | POA: Diagnosis not present

## 2017-08-02 DIAGNOSIS — M1990 Unspecified osteoarthritis, unspecified site: Secondary | ICD-10-CM | POA: Insufficient documentation

## 2017-08-02 DIAGNOSIS — F17210 Nicotine dependence, cigarettes, uncomplicated: Secondary | ICD-10-CM | POA: Diagnosis not present

## 2017-08-02 DIAGNOSIS — F329 Major depressive disorder, single episode, unspecified: Secondary | ICD-10-CM | POA: Diagnosis not present

## 2017-08-02 DIAGNOSIS — R590 Localized enlarged lymph nodes: Secondary | ICD-10-CM | POA: Diagnosis present

## 2017-08-02 DIAGNOSIS — B1920 Unspecified viral hepatitis C without hepatic coma: Secondary | ICD-10-CM | POA: Diagnosis not present

## 2017-08-02 DIAGNOSIS — F419 Anxiety disorder, unspecified: Secondary | ICD-10-CM | POA: Diagnosis not present

## 2017-08-02 DIAGNOSIS — K219 Gastro-esophageal reflux disease without esophagitis: Secondary | ICD-10-CM | POA: Insufficient documentation

## 2017-08-02 HISTORY — DX: Myoneural disorder, unspecified: G70.9

## 2017-08-02 HISTORY — DX: Gastro-esophageal reflux disease without esophagitis: K21.9

## 2017-08-02 HISTORY — DX: Unspecified osteoarthritis, unspecified site: M19.90

## 2017-08-02 LAB — APTT: aPTT: 34 seconds (ref 24–36)

## 2017-08-02 LAB — PROTIME-INR
INR: 0.94
Prothrombin Time: 12.5 seconds (ref 11.4–15.2)

## 2017-08-02 LAB — CBC
HCT: 42.1 % (ref 40.0–52.0)
HEMOGLOBIN: 14 g/dL (ref 13.0–18.0)
MCH: 33.5 pg (ref 26.0–34.0)
MCHC: 33.3 g/dL (ref 32.0–36.0)
MCV: 100.6 fL — ABNORMAL HIGH (ref 80.0–100.0)
Platelets: 116 10*3/uL — ABNORMAL LOW (ref 150–440)
RBC: 4.18 MIL/uL — AB (ref 4.40–5.90)
RDW: 12.8 % (ref 11.5–14.5)
WBC: 8.1 10*3/uL (ref 3.8–10.6)

## 2017-08-02 MED ORDER — SODIUM CHLORIDE 0.9 % IV SOLN: INTRAVENOUS | Status: DC

## 2017-08-02 NOTE — Procedures (Signed)
US guided FNA of neck nodule.  7 FNAs obtained.  Minimal blood loss and no immediate complication.
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
## 2017-08-05 LAB — CYTOLOGY - NON PAP

## 2017-10-17 ENCOUNTER — Encounter: Payer: Self-pay | Admitting: Emergency Medicine

## 2017-10-17 ENCOUNTER — Emergency Department
Admission: EM | Admit: 2017-10-17 | Discharge: 2017-10-17 | Disposition: A | Discharge status: Home / Self Care | Payer: Medicaid Other | Attending: Student in an Organized Health Care Education/Training Program | Admitting: Student in an Organized Health Care Education/Training Program

## 2017-10-17 ENCOUNTER — Other Ambulatory Visit: Payer: Self-pay

## 2017-10-17 ENCOUNTER — Emergency Department: Payer: Medicaid Other

## 2017-10-17 DIAGNOSIS — Y999 Unspecified external cause status: Secondary | ICD-10-CM | POA: Diagnosis not present

## 2017-10-17 DIAGNOSIS — W001XXA Fall from stairs and steps due to ice and snow, initial encounter: Secondary | ICD-10-CM | POA: Diagnosis not present

## 2017-10-17 DIAGNOSIS — E119 Type 2 diabetes mellitus without complications: Secondary | ICD-10-CM | POA: Diagnosis not present

## 2017-10-17 DIAGNOSIS — Y93H1 Activity, digging, shoveling and raking: Secondary | ICD-10-CM | POA: Diagnosis not present

## 2017-10-17 DIAGNOSIS — S62306A Unspecified fracture of fifth metacarpal bone, right hand, initial encounter for closed fracture: Secondary | ICD-10-CM | POA: Diagnosis not present

## 2017-10-17 DIAGNOSIS — S6992XA Unspecified injury of left wrist, hand and finger(s), initial encounter: Secondary | ICD-10-CM | POA: Diagnosis present

## 2017-10-17 DIAGNOSIS — Z79899 Other long term (current) drug therapy: Secondary | ICD-10-CM | POA: Insufficient documentation

## 2017-10-17 DIAGNOSIS — Y92009 Unspecified place in unspecified non-institutional (private) residence as the place of occurrence of the external cause: Secondary | ICD-10-CM | POA: Diagnosis not present

## 2017-10-17 DIAGNOSIS — F17210 Nicotine dependence, cigarettes, uncomplicated: Secondary | ICD-10-CM | POA: Diagnosis not present

## 2017-10-17 MED ORDER — OXYCODONE-ACETAMINOPHEN 5-325 MG PO TABS
1.0000 | ORAL_TABLET | Freq: Once | ORAL | Status: AC
  Administered 2017-10-17: 1 via ORAL
  Filled 2017-10-17: qty 1

## 2017-10-17 MED ORDER — OXYCODONE-ACETAMINOPHEN 5-325 MG PO TABS: 1.0000 | ORAL_TABLET | Freq: Four times a day (QID) | ORAL | 0 refills | Status: AC | PRN

## 2017-10-17 NOTE — ED Triage Notes (Signed)
States he fell yesterday  Injury to right hand  Positive swelling ntoed
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
## 2018-04-12 IMAGING — US US ABDOMEN COMPLETE W/ ELASTOGRAPHY
1 series · 13 of 25 positions shown · non-contrast
Comparison: 05/22/2016

CLINICAL DATA: Chronic hepatitis-C



[Series 1: us abdomen complete w/ elastography · 0.20mm/px · 13 of 97 slices shown]
[im 1/97]
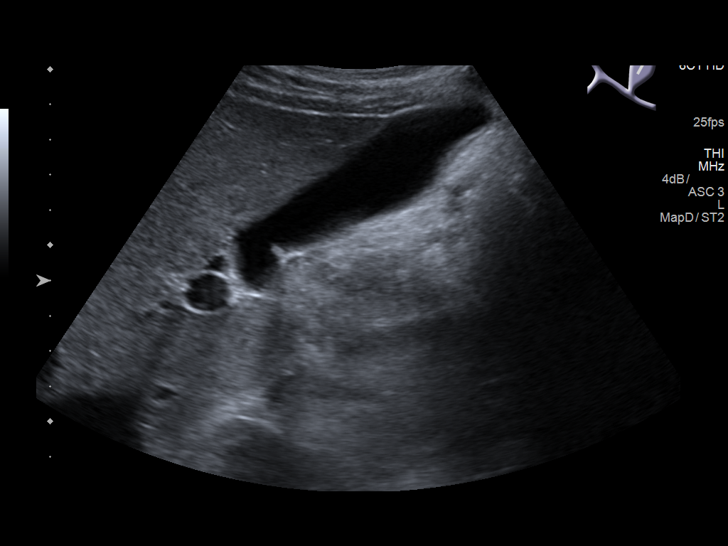
[im 9/97]
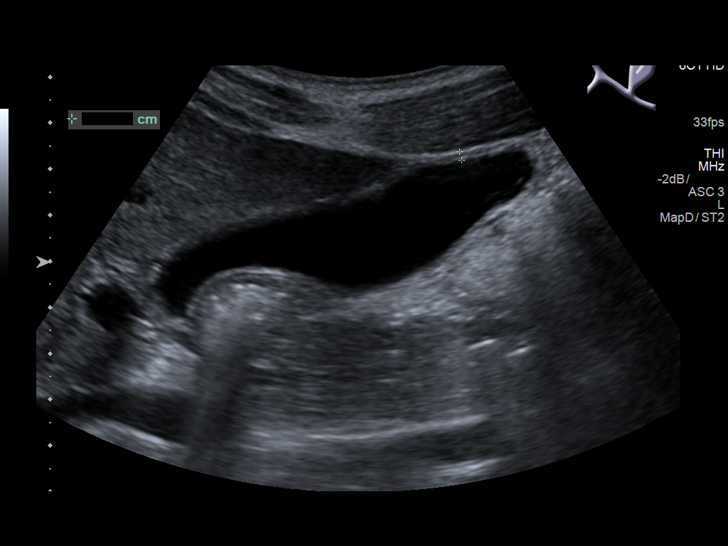
[im 17/97]
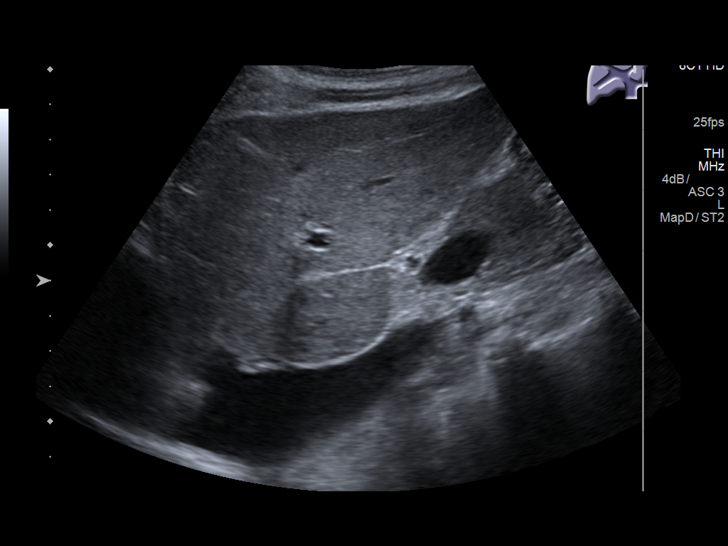
[im 25/97]
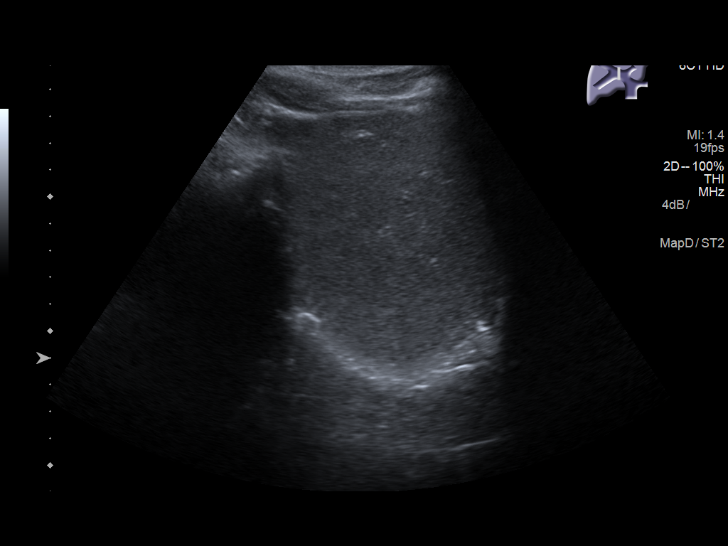
[im 33/97]
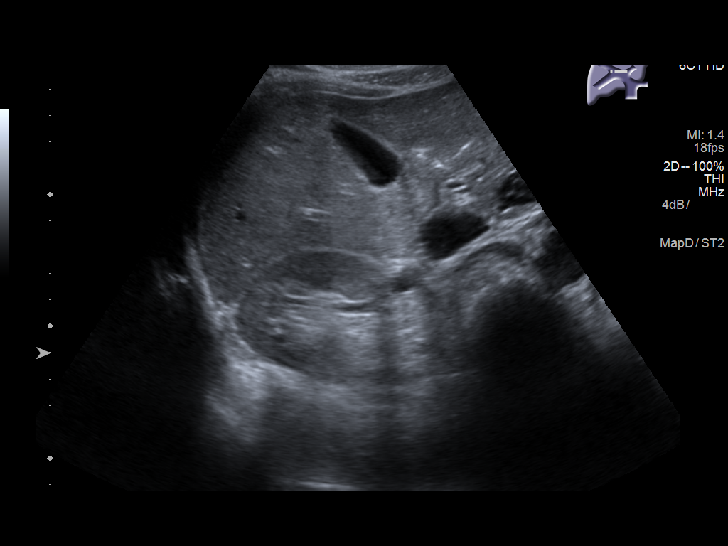
[im 41/97]
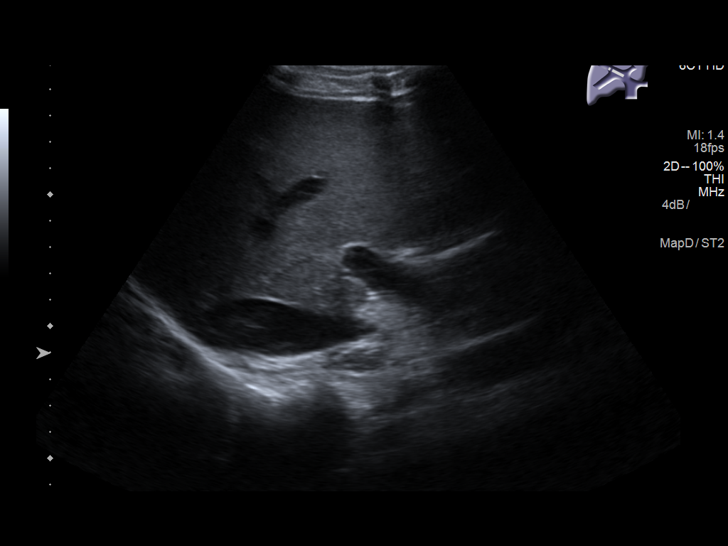
[im 49/97]
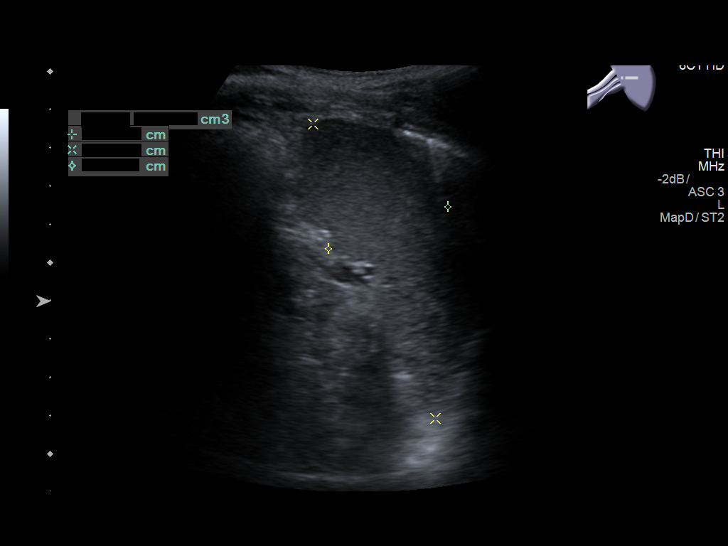
[im 57/97]
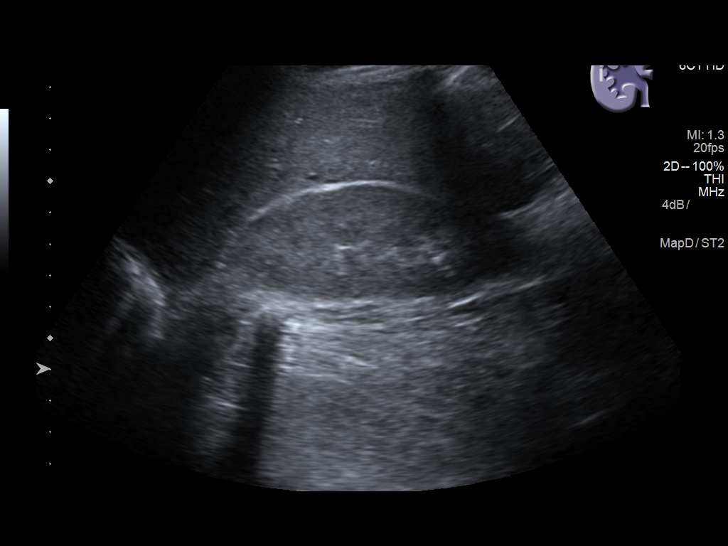
[im 65/97]
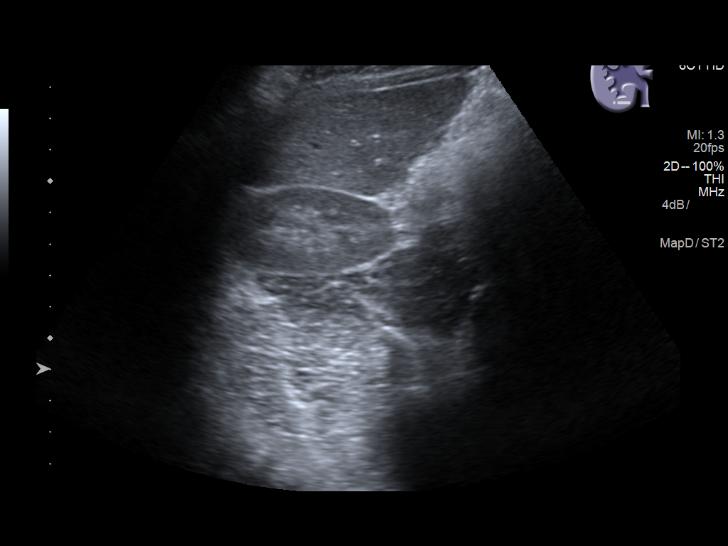
[im 73/97]
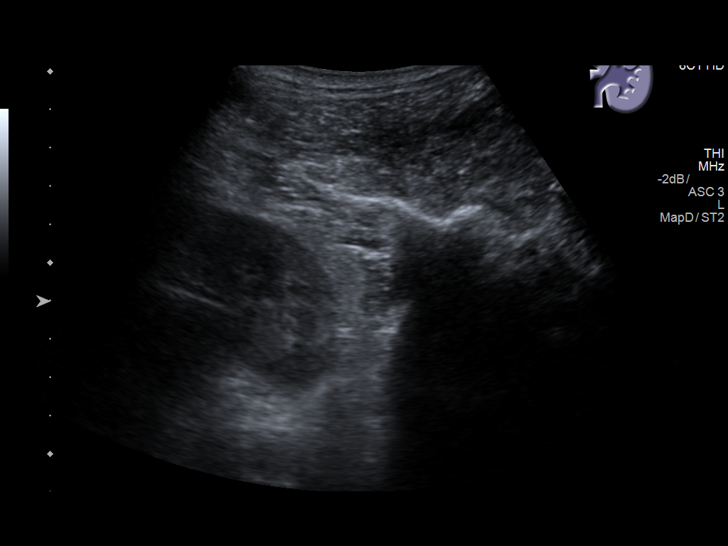
[im 81/97]
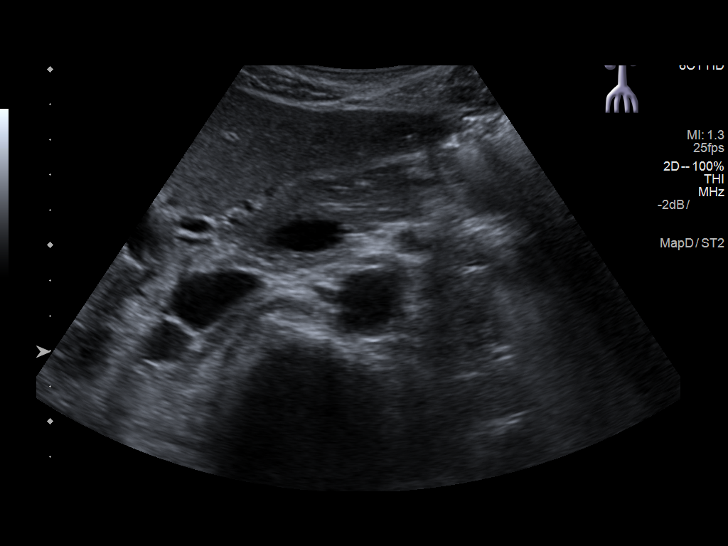
[im 89/97]
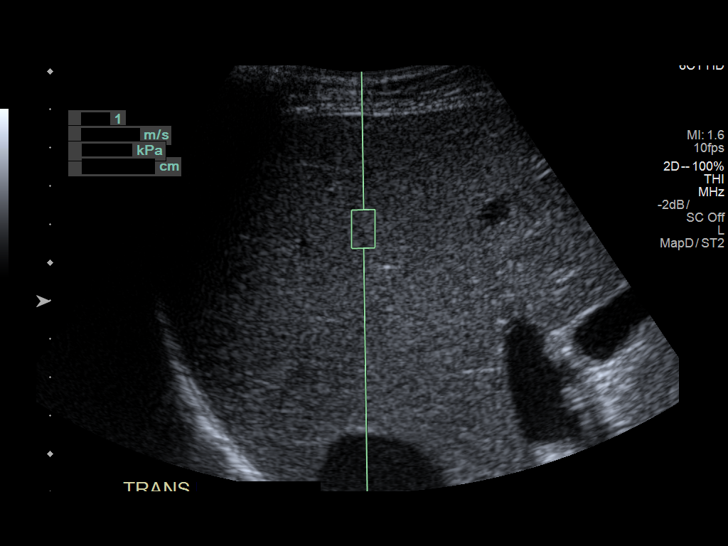
[im 97/97]
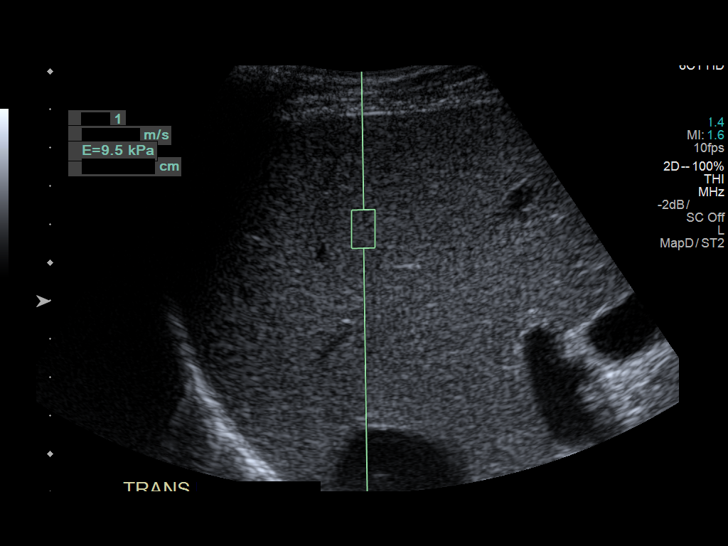

[13 of 25 positions shown; findings below may reference images not displayed]

FINDINGS: ULTRASOUND ABDOMEN

Gallbladder: No gallstones or wall thickening visualized. No
sonographic Murphy sign noted by sonographer.

Common bile duct: Diameter: Normal caliber, 2 mm

Liver: Increased echotexture compatible with fatty infiltration. No
focal abnormality or biliary ductal dilatation. Portal vein is
patent on color Doppler imaging with normal direction of blood flow
towards the liver.

IVC: No abnormality visualized.

Pancreas: Visualized portion unremarkable.

Spleen: Size and appearance within normal limits.

Right Kidney: Length: 10.5 cm. Echogenicity within normal limits. No
mass or hydronephrosis visualized.

Left Kidney: Length: 10.4 cm. Echogenicity within normal limits. No
mass or hydronephrosis visualized.

Abdominal aorta: No aneurysm visualized.

Other findings: None.

ULTRASOUND HEPATIC ELASTOGRAPHY

Device: Siemens Helix VTQ

Patient position: Supine

Transducer 6C1

Number of measurements: 10

Hepatic segment:  8

Median velocity:   1.79  m/sec

IQR:

IQR/Median velocity ratio:

Corresponding Metavir fibrosis score:  F2 + some F3

Risk of fibrosis: Moderate

Limitations of exam: None

Pertinent findings noted on other imaging exams:  None

Please note that abnormal shear wave velocities may also be
identified in clinical settings other than with hepatic fibrosis,
such as: acute hepatitis, elevated right heart and central venous
pressures including use of beta blockers, Amazigh disease
(Databex), infiltrative processes such as
mastocytosis/amyloidosis/infiltrative tumor, extrahepatic
cholestasis, in the post-prandial state, and liver transplantation.
Correlation with patient history, laboratory data, and clinical
condition recommended.
IMPRESSION: ULTRASOUND ABDOMEN:
Fatty infiltration of the liver.

ULTRASOUND HEPATIC ELASTOGRAPHY:

Median hepatic shear wave velocity is calculated at 1.79 m/sec.

Corresponding Metavir fibrosis score is  F2 + some F3.

Risk of fibrosis is Moderate.

Follow-up: Additional testing appropriate

## 2018-04-28 IMAGING — CT CT NECK W/ CM
3 of 5 series · 12 of 34 positions shown, 14 images · IV contrast (iopamidol)
Comparison: None.

CLINICAL DATA: Acute lymphadenitis. Left submandibular soft tissue
swelling despite recent antibiotic therapy. 20 pound weight loss.

EXAM:
CT NECK WITH CONTRAST
TECHNIQUE: Multidetector CT imaging of the neck was performed using the
standard protocol following the bolus administration of intravenous
contrast.
CONTRAST:  75mL ACJRRQ-DEE IOPAMIDOL (ACJRRQ-DEE) INJECTION 61%

[Series 6: sag neck · sagittal · 0.40mm/px · 5 of 115 slices shown, 6 images]
[im 39/115  bone]
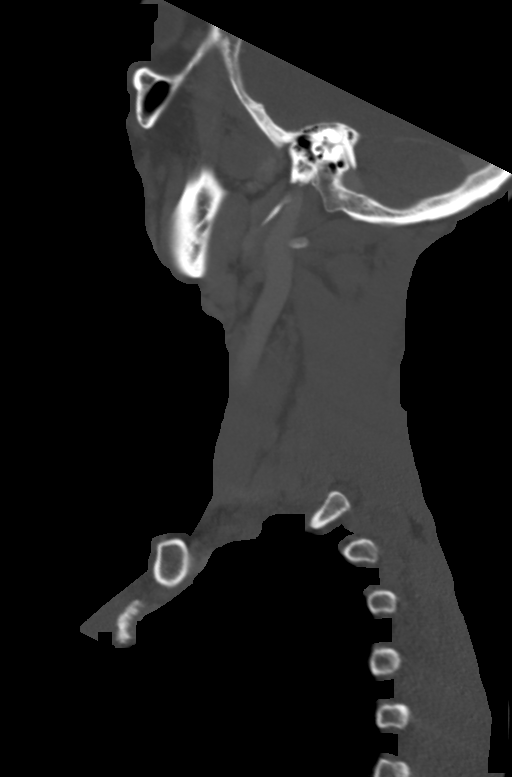
[im 48/115  bone]
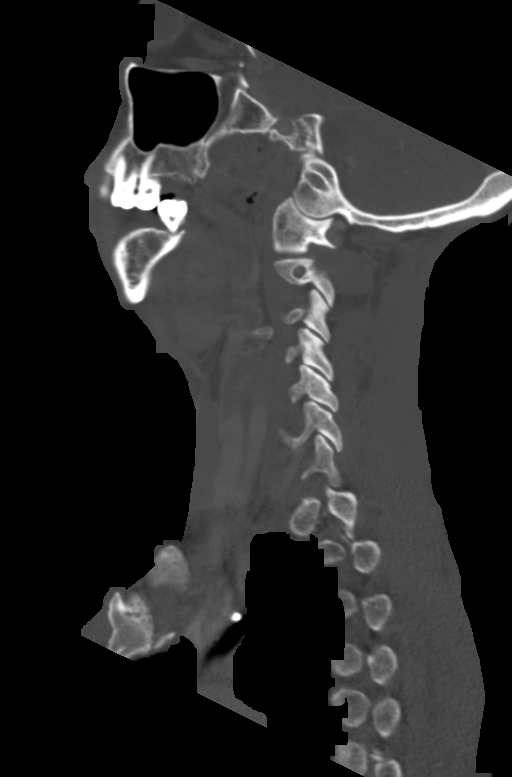
[im 58/115  soft-tissue]
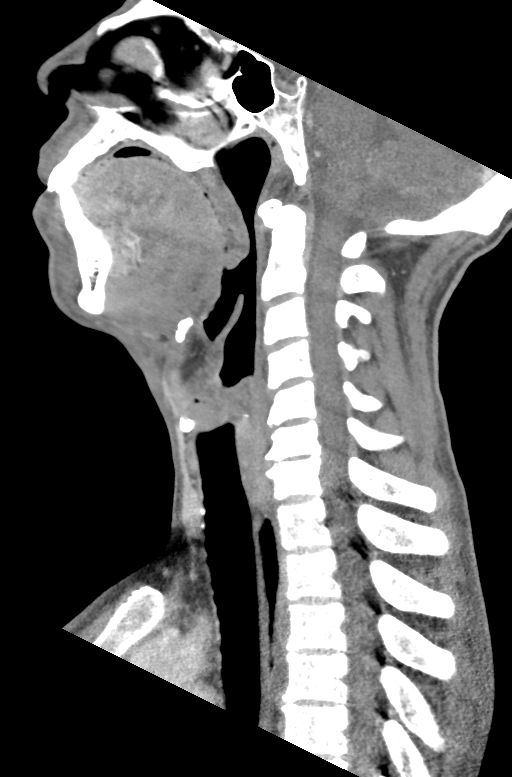
[im 58/115  bone]
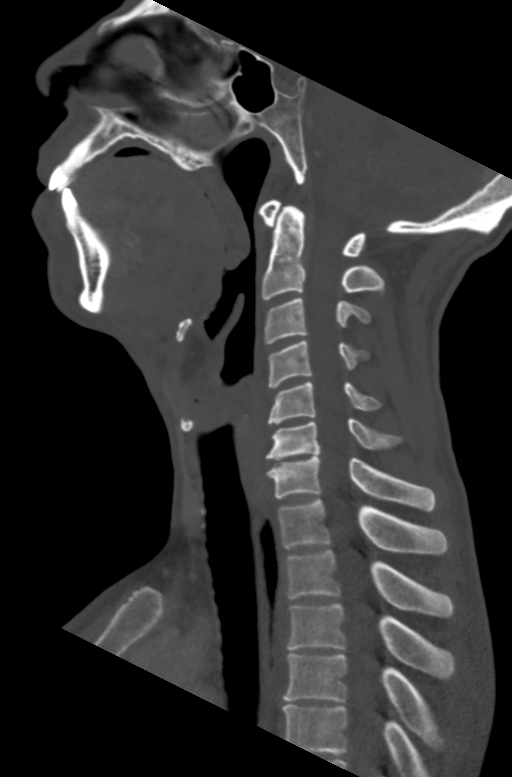
[im 67/115  bone]
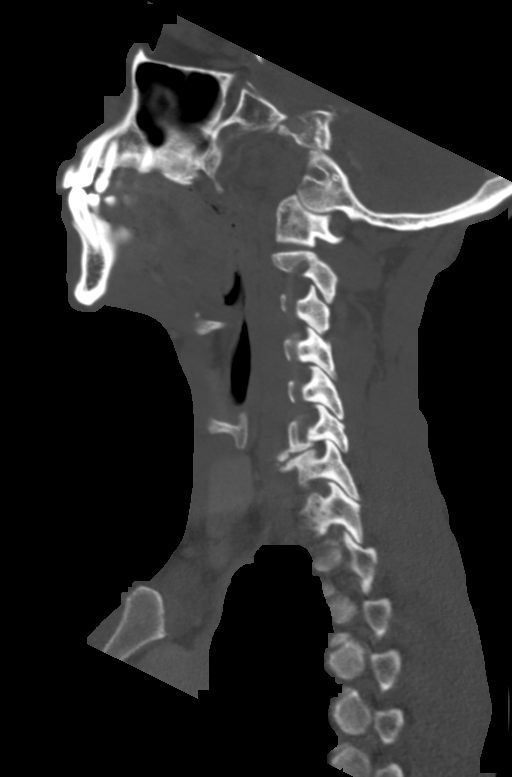
[im 77/115  bone]
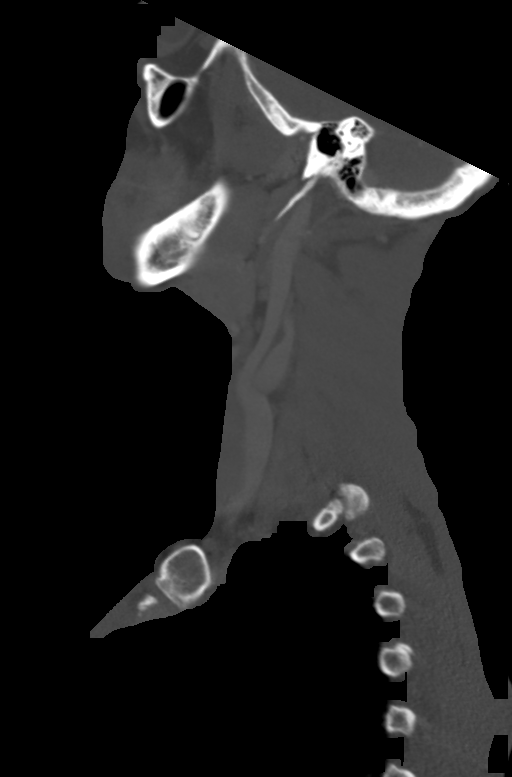

[Series 7: cor neck · coronal · 0.45mm/px · 3 of 96 slices shown]
[im 33/96  bone]
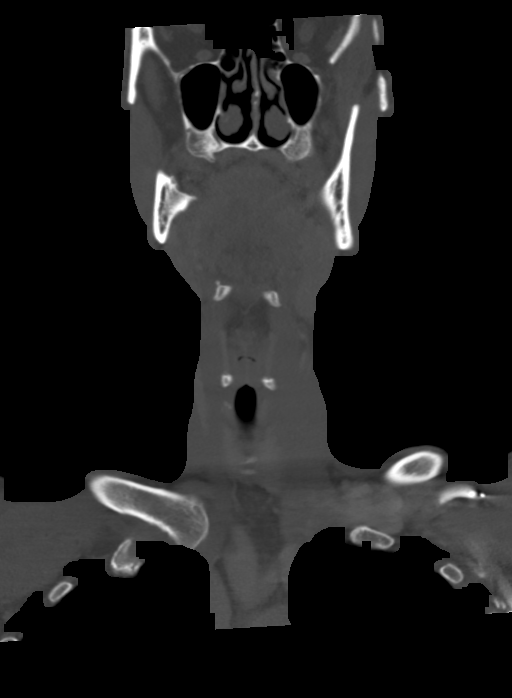
[im 43/96  bone]
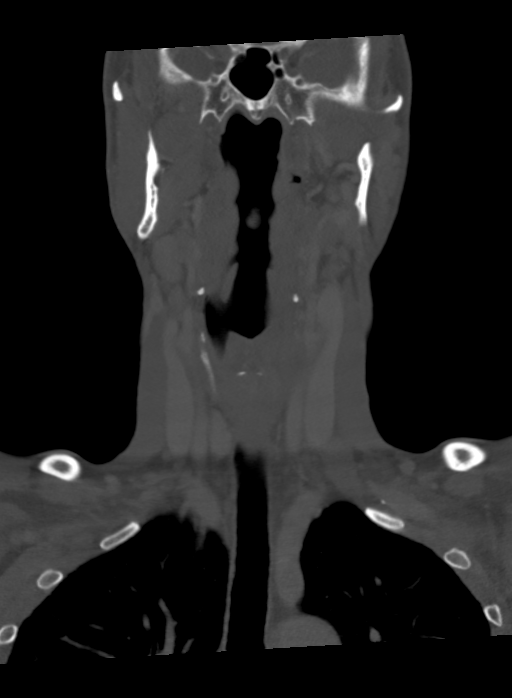
[im 53/96  bone]
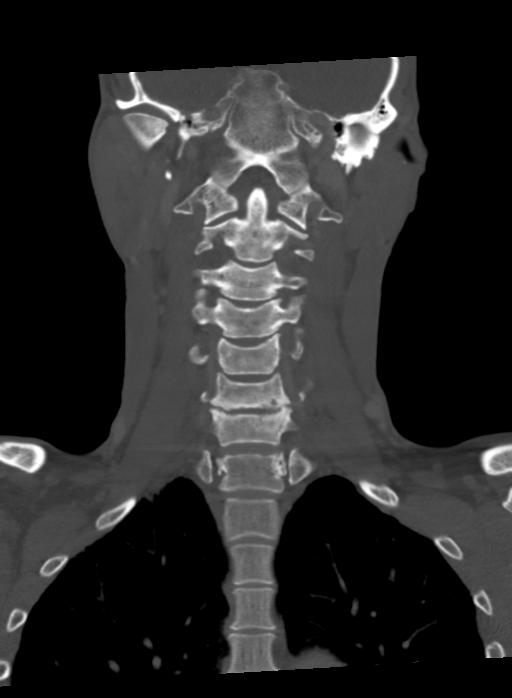

[Series 8: orthogonal ax · axial · 0.39mm/px · z∈[-39,+168]mm · 4 of 174 slices shown, 5 images]
[im 29/174  soft-tissue]
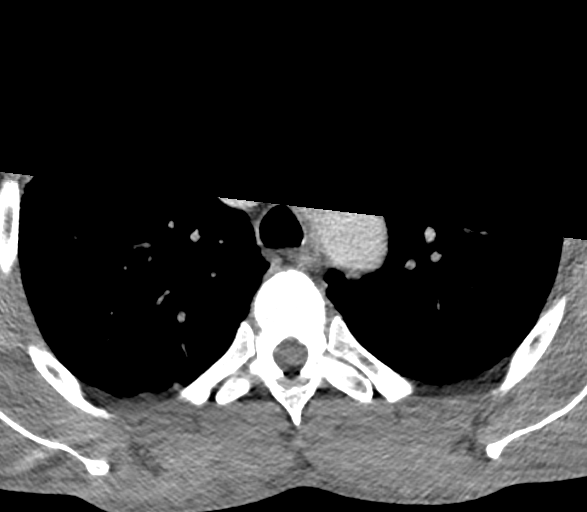
[im 29/174  bone]
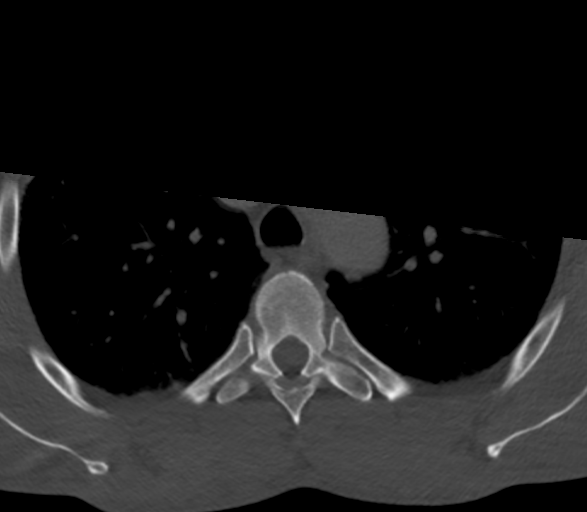
[im 58/174  bone]
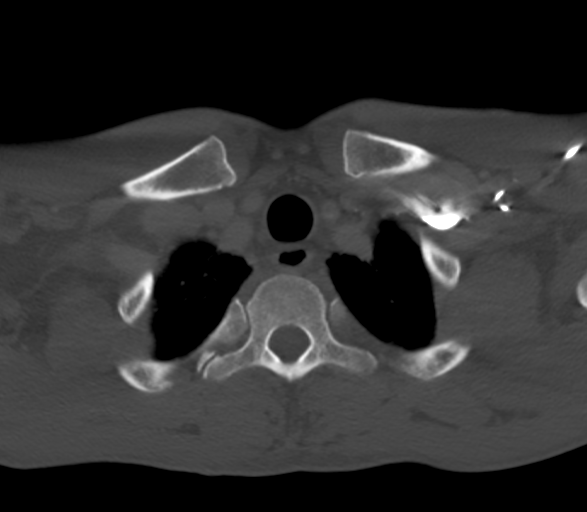
[im 116/174  bone]
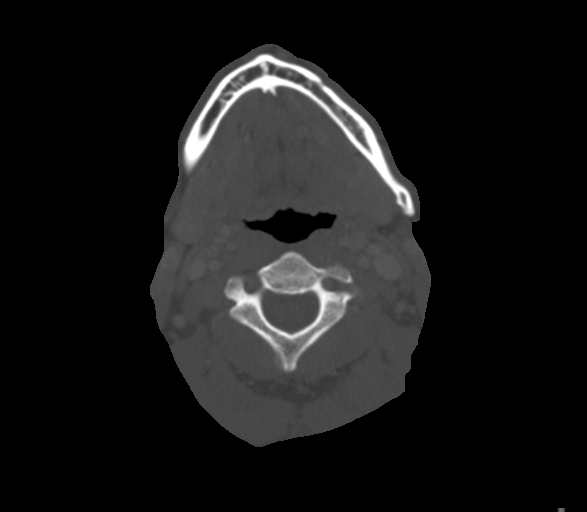
[im 145/174  bone]
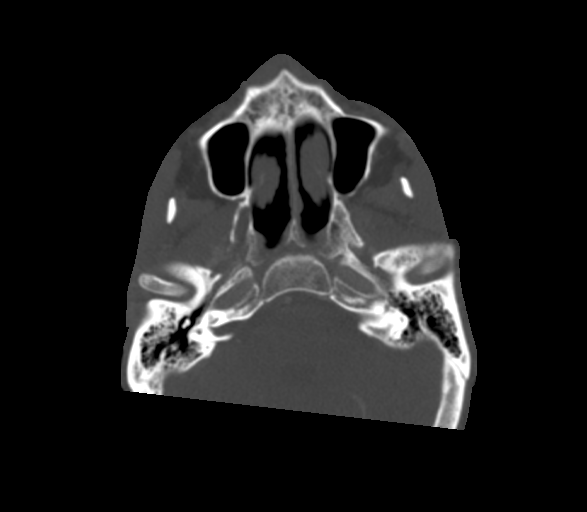

[12 of 34 positions shown; findings below may reference images not displayed]

FINDINGS: Pharynx and larynx: No focal mucosal or submucosal lesions are
present. palatine tonsils are within normal limits bilaterally. The
oropharynx, nasopharynx, and hypopharynx are clear. Vocal cords are
midline and symmetric. The larynx is unremarkable.

Salivary glands: The submandibular and parotid glands are within
normal limits bilaterally.

Thyroid: Normal.

Lymph nodes: A peripherally enhancing irregular collection is
present in the anterior left neck measuring 14 x 18 x 15 mm. The
low-density collection extends to the skin surface with subcutaneous
edema extending laterally and inferiorly. The lesion is below the
mylohyoid.

No significant cervical adenopathy is present otherwise.

Vascular: No significant vascular lesions are present.

Limited intracranial: Limited imaging of the brain is unremarkable.

Visualized orbits: The visualized globes and orbits are within
normal limits.

Mastoids and visualized paranasal sinuses: The paranasal sinuses and
mastoid air cells are clear.

Skeleton: Focal degenerative changes of cervical spine are most
pronounced at C6-7 with uncovertebral spurring resulting in osseous
foraminal narrowing bilaterally. No focal lytic or blastic lesions
are present. No focal dental caries or periapical lucencies are
noted.

Upper chest: The lung apices are clear.

Other: No other focal cancer subcutaneous lesions are present.
IMPRESSION: 1. Ill-defined peripherally enhancing collection extending to the
skin surface in the left submandibular neck measures 14 x 18 x 15
mm. This is favored to be a skin lesion, likely infectious or
inflammatory. Neoplasm is considered the less likely. Direct
inspection sampling could be performed.
2. No other significant adenopathy.
3. No other primary neoplasm in the head and neck.
4. Mild focal degenerative changes of the cervical spine at C6-7.

## 2018-09-07 ENCOUNTER — Other Ambulatory Visit: Payer: Self-pay

## 2018-09-07 ENCOUNTER — Emergency Department
Admission: EM | Admit: 2018-09-07 | Discharge: 2018-09-07 | Disposition: A | Discharge status: Home / Self Care | Payer: Medicaid Other | Attending: Emergency Medicine | Admitting: Emergency Medicine

## 2018-09-07 DIAGNOSIS — F17210 Nicotine dependence, cigarettes, uncomplicated: Secondary | ICD-10-CM | POA: Diagnosis not present

## 2018-09-07 DIAGNOSIS — E119 Type 2 diabetes mellitus without complications: Secondary | ICD-10-CM | POA: Insufficient documentation

## 2018-09-07 DIAGNOSIS — T7840XA Allergy, unspecified, initial encounter: Secondary | ICD-10-CM

## 2018-09-07 DIAGNOSIS — L509 Urticaria, unspecified: Secondary | ICD-10-CM | POA: Insufficient documentation

## 2018-09-07 MED ORDER — FAMOTIDINE IN NACL 20-0.9 MG/50ML-% IV SOLN
20.0000 mg | Freq: Once | INTRAVENOUS | Status: AC
  Administered 2018-09-07: 20 mg via INTRAVENOUS
  Filled 2018-09-07: qty 50

## 2018-09-07 MED ORDER — EPINEPHRINE 0.3 MG/0.3ML IJ SOAJ
INTRAMUSCULAR | Status: AC
  Filled 2018-09-07: qty 0.3

## 2018-09-07 MED ORDER — METHYLPREDNISOLONE SODIUM SUCC 125 MG IJ SOLR
125.0000 mg | Freq: Once | INTRAMUSCULAR | Status: AC
  Administered 2018-09-07: 125 mg via INTRAVENOUS
  Filled 2018-09-07: qty 2

## 2018-09-07 MED ORDER — DIPHENHYDRAMINE HCL 50 MG/ML IJ SOLN
25.0000 mg | Freq: Once | INTRAMUSCULAR | Status: AC
  Administered 2018-09-07: 25 mg via INTRAVENOUS
  Filled 2018-09-07: qty 1

## 2018-09-07 MED ORDER — PREDNISONE 10 MG (21) PO TBPK: ORAL_TABLET | Freq: Every day | ORAL | 0 refills | Status: DC

## 2018-09-07 MED ORDER — DIPHENHYDRAMINE HCL 25 MG PO TABS: 25.0000 mg | ORAL_TABLET | Freq: Four times a day (QID) | ORAL | 0 refills | Status: DC | PRN

## 2018-09-07 MED ORDER — DIPHENHYDRAMINE HCL 50 MG/ML IJ SOLN
50.0000 mg | Freq: Once | INTRAMUSCULAR | Status: AC
  Administered 2018-09-07: 50 mg via INTRAVENOUS
  Filled 2018-09-07: qty 1

## 2018-09-07 MED ORDER — SODIUM CHLORIDE 0.9 % IV SOLN
Freq: Once | INTRAVENOUS | Status: AC
Start: 2018-09-07 — End: 2018-09-07
  Administered 2018-09-07: 22:00:00 via INTRAVENOUS
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
## 2018-09-07 NOTE — ED Notes (Signed)
Peripheral IV discontinued. Catheter intact. No signs of infiltration or redness. Gauze applied to IV site.    Discharge instructions reviewed with patient. Questions fielded by this RN. Patient verbalizes understanding of instructions. Patient discharged home in stable condition per williams. No acute distress noted at time of discharge.   

## 2018-09-07 NOTE — ED Triage Notes (Signed)
Hives noted all over body. States "my skin is on fire." reaction to either sauce from neighbor or jalenpeno. States feels like throat is swelling but speaking in complete sentences. Airway open at this time. A&O. Itching ridiculously in triage. Took a small amount of liquid benadryl.

## 2018-09-07 NOTE — ED Notes (Signed)
Pt reports severe itching and present with puritus and raised whelts (that burn) all over trunck

## 2018-09-07 NOTE — ED Triage Notes (Signed)
Pt arrived from home after eating peppers that a neighbor made. Pt has numerous hives to entire body. Pt actively scratching and complaining of burning.

## 2019-08-16 ENCOUNTER — Emergency Department
Admission: EM | Admit: 2019-08-16 | Discharge: 2019-08-17 | Disposition: A | Discharge status: Home / Self Care | Payer: Medicaid Other | Attending: Emergency Medicine | Admitting: Emergency Medicine

## 2019-08-16 DIAGNOSIS — E119 Type 2 diabetes mellitus without complications: Secondary | ICD-10-CM | POA: Diagnosis not present

## 2019-08-16 DIAGNOSIS — F17210 Nicotine dependence, cigarettes, uncomplicated: Secondary | ICD-10-CM | POA: Diagnosis not present

## 2019-08-16 DIAGNOSIS — L509 Urticaria, unspecified: Secondary | ICD-10-CM

## 2019-08-16 DIAGNOSIS — T7840XA Allergy, unspecified, initial encounter: Secondary | ICD-10-CM | POA: Diagnosis not present

## 2019-08-16 NOTE — ED Triage Notes (Signed)
Patient with hives and itching all over.
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
## 2019-08-17 MED ORDER — DIPHENHYDRAMINE HCL 50 MG/ML IJ SOLN
25.0000 mg | Freq: Once | INTRAMUSCULAR | Status: AC
  Administered 2019-08-17: 25 mg via INTRAVENOUS

## 2019-08-17 MED ORDER — FAMOTIDINE 20 MG PO TABS: 20.0000 mg | ORAL_TABLET | Freq: Two times a day (BID) | ORAL | 0 refills | Status: AC

## 2019-08-17 MED ORDER — PREDNISONE 20 MG PO TABS: ORAL_TABLET | ORAL | 0 refills | Status: DC

## 2019-08-17 MED ORDER — FAMOTIDINE IN NACL 20-0.9 MG/50ML-% IV SOLN
20.0000 mg | Freq: Once | INTRAVENOUS | Status: AC
  Administered 2019-08-17: 20 mg via INTRAVENOUS
  Filled 2019-08-17: qty 50

## 2019-08-17 MED ORDER — SODIUM CHLORIDE 0.9 % IV BOLUS
1000.0000 mL | Freq: Once | INTRAVENOUS | Status: AC
  Administered 2019-08-17: 1000 mL via INTRAVENOUS

## 2019-08-17 MED ORDER — METHYLPREDNISOLONE SODIUM SUCC 125 MG IJ SOLR
125.0000 mg | Freq: Once | INTRAMUSCULAR | Status: AC
  Administered 2019-08-17: 125 mg via INTRAVENOUS

## 2019-08-17 MED ORDER — DIPHENHYDRAMINE HCL 50 MG/ML IJ SOLN
INTRAMUSCULAR | Status: AC
  Administered 2019-08-17: 25 mg via INTRAVENOUS
  Filled 2019-08-17: qty 1

## 2019-08-17 MED ORDER — METHYLPREDNISOLONE SODIUM SUCC 125 MG IJ SOLR
INTRAMUSCULAR | Status: AC
  Administered 2019-08-17: 125 mg via INTRAVENOUS
  Filled 2019-08-17: qty 2

## 2019-08-17 NOTE — ED Notes (Signed)
Hives on pt's torso receded, pt sleeping, EDP aware

## 2019-08-17 NOTE — ED Notes (Signed)
Peripheral IV discontinued. Catheter intact. No signs of infiltration or redness. Gauze applied to IV site.   Discharge instructions reviewed with patient. Questions fielded by this RN. Patient verbalizes understanding of instructions. Patient discharged home in stable condition per Sung. No acute distress noted at time of discharge.   

## 2019-08-17 NOTE — ED Notes (Signed)
Dr Beather Arbour just coming out of room after checking on patient; she says pt is asleep

## 2019-08-17 NOTE — Discharge Instructions (Signed)
1. Take the following medicines for the next 4 days: °Prednisone 60mg daily °Pepcid 20mg twice daily °2. Take Benadryl as needed for itching. °3. Return to the ER for worsening symptoms, persistent vomiting, difficulty breathing or other concerns.  °

## 2019-08-17 NOTE — ED Notes (Signed)
Pt reports possible reaction to peppers after eating spaghetti with green peppers in sauceand had similar reaction tuesday night after eating salsa  No known hx  No new allergens reported in house

## 2021-05-23 ENCOUNTER — Emergency Department: Payer: Medicaid Other

## 2021-05-23 ENCOUNTER — Other Ambulatory Visit: Payer: Self-pay

## 2021-05-23 ENCOUNTER — Emergency Department
Admission: EM | Admit: 2021-05-23 | Discharge: 2021-05-23 | Disposition: A | Discharge status: Home / Self Care | Payer: Medicaid Other | Attending: Emergency Medicine | Admitting: Emergency Medicine

## 2021-05-23 DIAGNOSIS — R21 Rash and other nonspecific skin eruption: Secondary | ICD-10-CM | POA: Diagnosis present

## 2021-05-23 DIAGNOSIS — R0789 Other chest pain: Secondary | ICD-10-CM | POA: Diagnosis not present

## 2021-05-23 DIAGNOSIS — L247 Irritant contact dermatitis due to plants, except food: Secondary | ICD-10-CM | POA: Diagnosis not present

## 2021-05-23 DIAGNOSIS — F17210 Nicotine dependence, cigarettes, uncomplicated: Secondary | ICD-10-CM | POA: Diagnosis not present

## 2021-05-23 DIAGNOSIS — E119 Type 2 diabetes mellitus without complications: Secondary | ICD-10-CM | POA: Diagnosis not present

## 2021-05-23 DIAGNOSIS — L03116 Cellulitis of left lower limb: Secondary | ICD-10-CM | POA: Diagnosis not present

## 2021-05-23 LAB — BASIC METABOLIC PANEL
Anion gap: 9 (ref 5–15)
BUN: 20 mg/dL (ref 6–20)
CO2: 28 mmol/L (ref 22–32)
Calcium: 9 mg/dL (ref 8.9–10.3)
Chloride: 101 mmol/L (ref 98–111)
Creatinine, Ser: 1.16 mg/dL (ref 0.61–1.24)
GFR, Estimated: >60 mL/min (ref >60)
Glucose, Bld: 137 mg/dL — ABNORMAL HIGH (ref 70–99)
Potassium: 3.9 mmol/L (ref 3.5–5.1)
Sodium: 138 mmol/L (ref 135–145)

## 2021-05-23 LAB — HIV ANTIBODY (ROUTINE TESTING W REFLEX): HIV Screen 4th Generation wRfx: NONREACTIVE

## 2021-05-23 LAB — CBC
HCT: 46.3 % (ref 39.0–52.0)
Hemoglobin: 15.4 g/dL (ref 13.0–17.0)
MCH: 34.1 pg — ABNORMAL HIGH (ref 26.0–34.0)
MCHC: 33.3 g/dL (ref 30.0–36.0)
MCV: 102.7 fL — ABNORMAL HIGH (ref 80.0–100.0)
Platelets: 146 10*3/uL — ABNORMAL LOW (ref 150–400)
RBC: 4.51 MIL/uL (ref 4.22–5.81)
RDW: 11.5 % (ref 11.5–15.5)
WBC: 8.9 10*3/uL (ref 4.0–10.5)
nRBC: 0 % (ref 0.0–0.2)

## 2021-05-23 LAB — TROPONIN I (HIGH SENSITIVITY)
Troponin I (High Sensitivity): 8 ng/L (ref <18)
Troponin I (High Sensitivity): 8 ng/L (ref <18)

## 2021-05-23 MED ORDER — DOXYCYCLINE HYCLATE 100 MG PO CAPS: 100.0000 mg | ORAL_CAPSULE | Freq: Two times a day (BID) | ORAL | 0 refills | Status: DC

## 2021-05-23 MED ORDER — PREDNISONE 50 MG PO TABS: ORAL_TABLET | ORAL | 0 refills | Status: DC

## 2021-05-23 MED ORDER — DOXYCYCLINE HYCLATE 100 MG PO TABS
100.0000 mg | ORAL_TABLET | Freq: Once | ORAL | Status: AC
  Administered 2021-05-23: 100 mg via ORAL
  Filled 2021-05-23: qty 1

## 2021-05-23 MED ORDER — BACITRACIN-NEOMYCIN-POLYMYXIN 400-5-5000 EX OINT
TOPICAL_OINTMENT | Freq: Once | CUTANEOUS | Status: AC
  Administered 2021-05-23: 1 via TOPICAL
  Filled 2021-05-23: qty 1

## 2021-05-23 MED ORDER — PREDNISONE 20 MG PO TABS
40.0000 mg | ORAL_TABLET | Freq: Once | ORAL | Status: AC
  Administered 2021-05-23: 40 mg via ORAL
  Filled 2021-05-23: qty 2

## 2021-05-23 MED ORDER — BACITRACIN-NEOMYCIN-POLYMYXIN 400-5-5000 EX OINT: 1.0000 "application " | TOPICAL_OINTMENT | Freq: Two times a day (BID) | CUTANEOUS | 0 refills | Status: AC

## 2021-05-23 NOTE — ED Notes (Signed)
Pt denies any chest pain or discomfort; pt is concerned about his rash.

## 2021-05-23 NOTE — ED Notes (Signed)
Pts left leg wound had been medicated with topical antibiotic and wrapped with nonadherent gauze and stabilized with wrap gauze and coban.
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
## 2021-05-23 NOTE — ED Triage Notes (Signed)
Pt here with CP that started 3 weeks ago. Pt states pain is centered and is intermittent. Pt also states that his heart has been racing lately. Pt also has rash to his left leg that has gotten worse and spread all over his body. Pt denies any new detergents or anything new. Weeping blisters noted to left leg.

## 2021-05-24 LAB — RPR: RPR Ser Ql: NONREACTIVE

## 2021-07-13 ENCOUNTER — Ambulatory Visit: Payer: Medicaid Other | Admitting: Dermatology

## 2021-10-24 ENCOUNTER — Ambulatory Visit: Payer: Medicaid Other | Admitting: Dermatology

## 2022-03-10 IMAGING — CR DG CHEST 2V
1 series · 2 of 2 positions shown · non-contrast
Comparison: None.

CLINICAL DATA: Chest pain

EXAM:
CHEST - 2 VIEW

[Series 1: dg chest 2 view · 0.14mm/px · 2 of 2 slices shown]
[im 1/2]
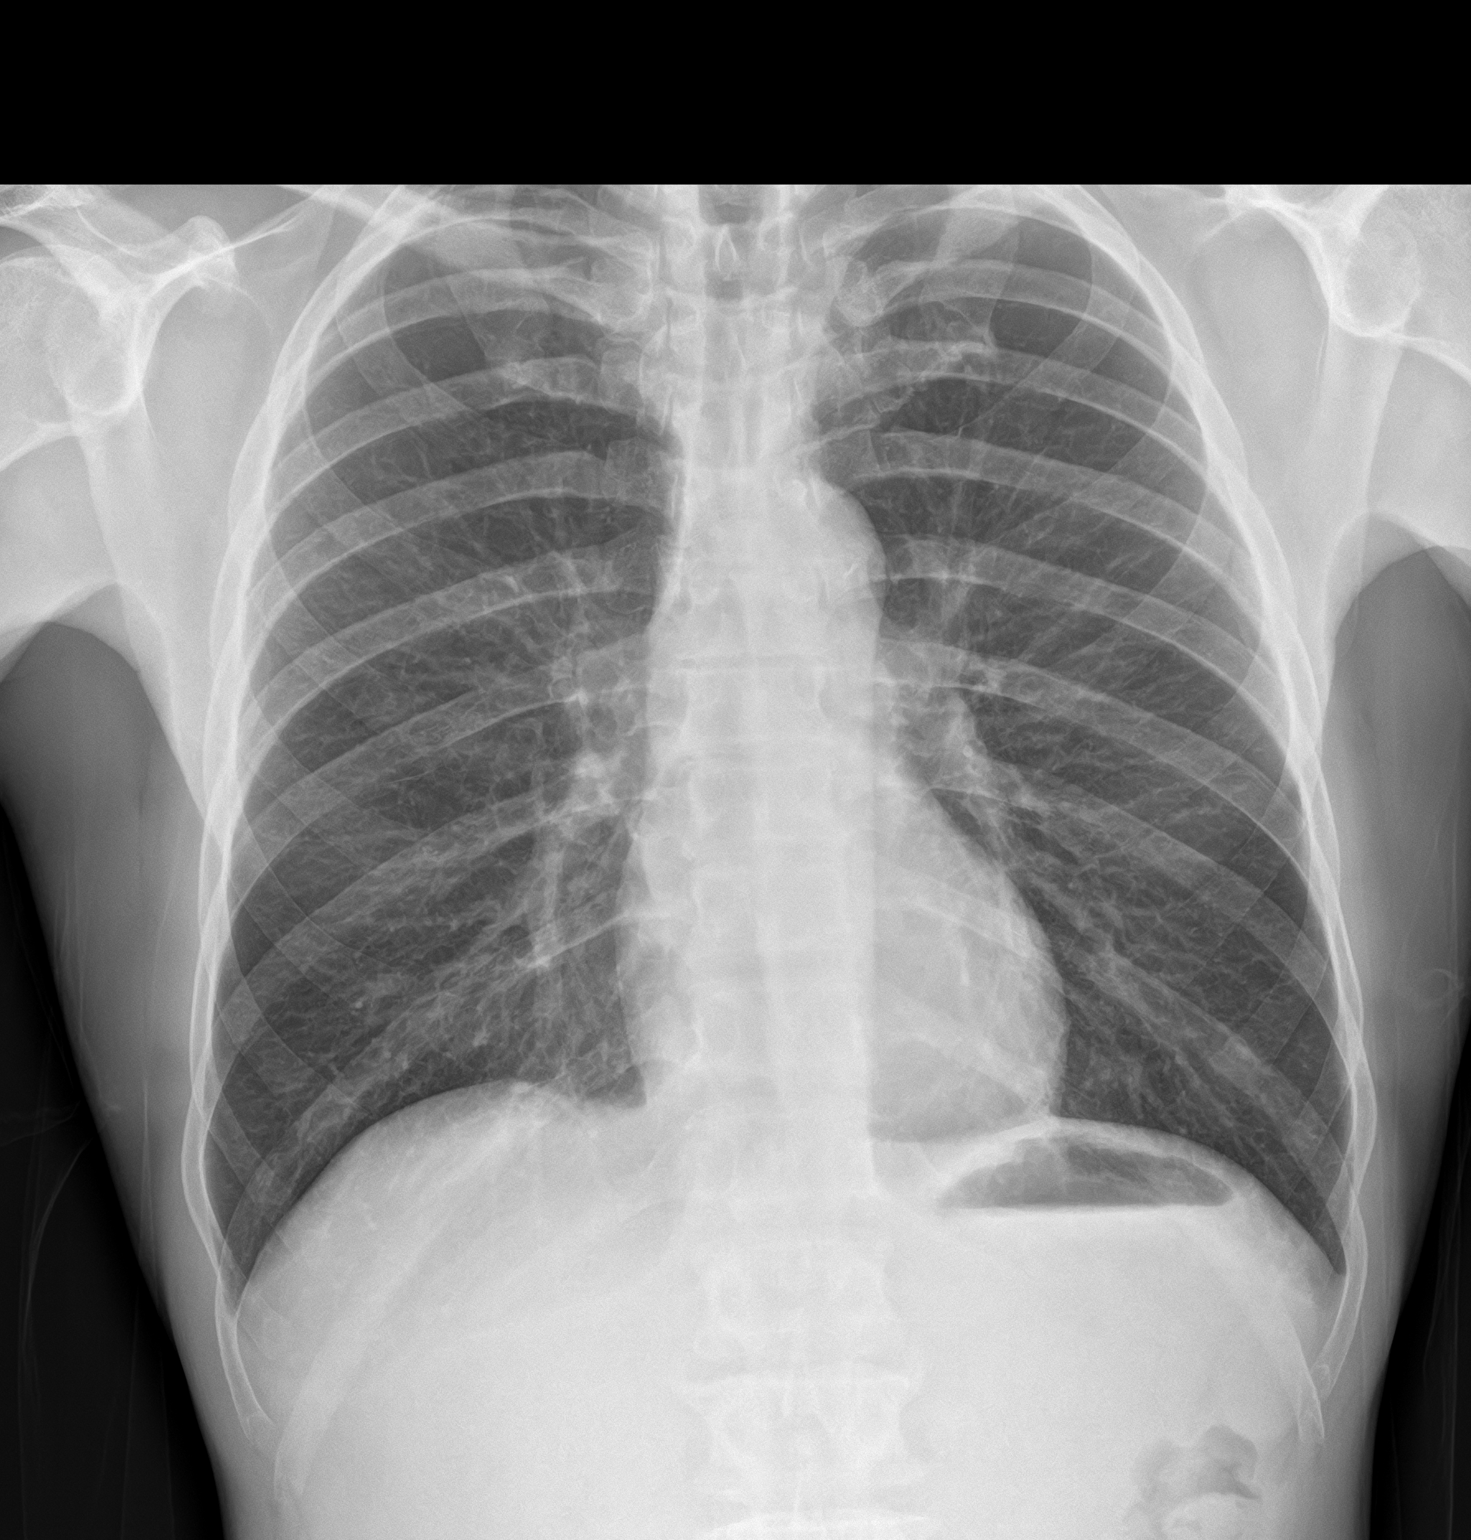
[im 2/2]
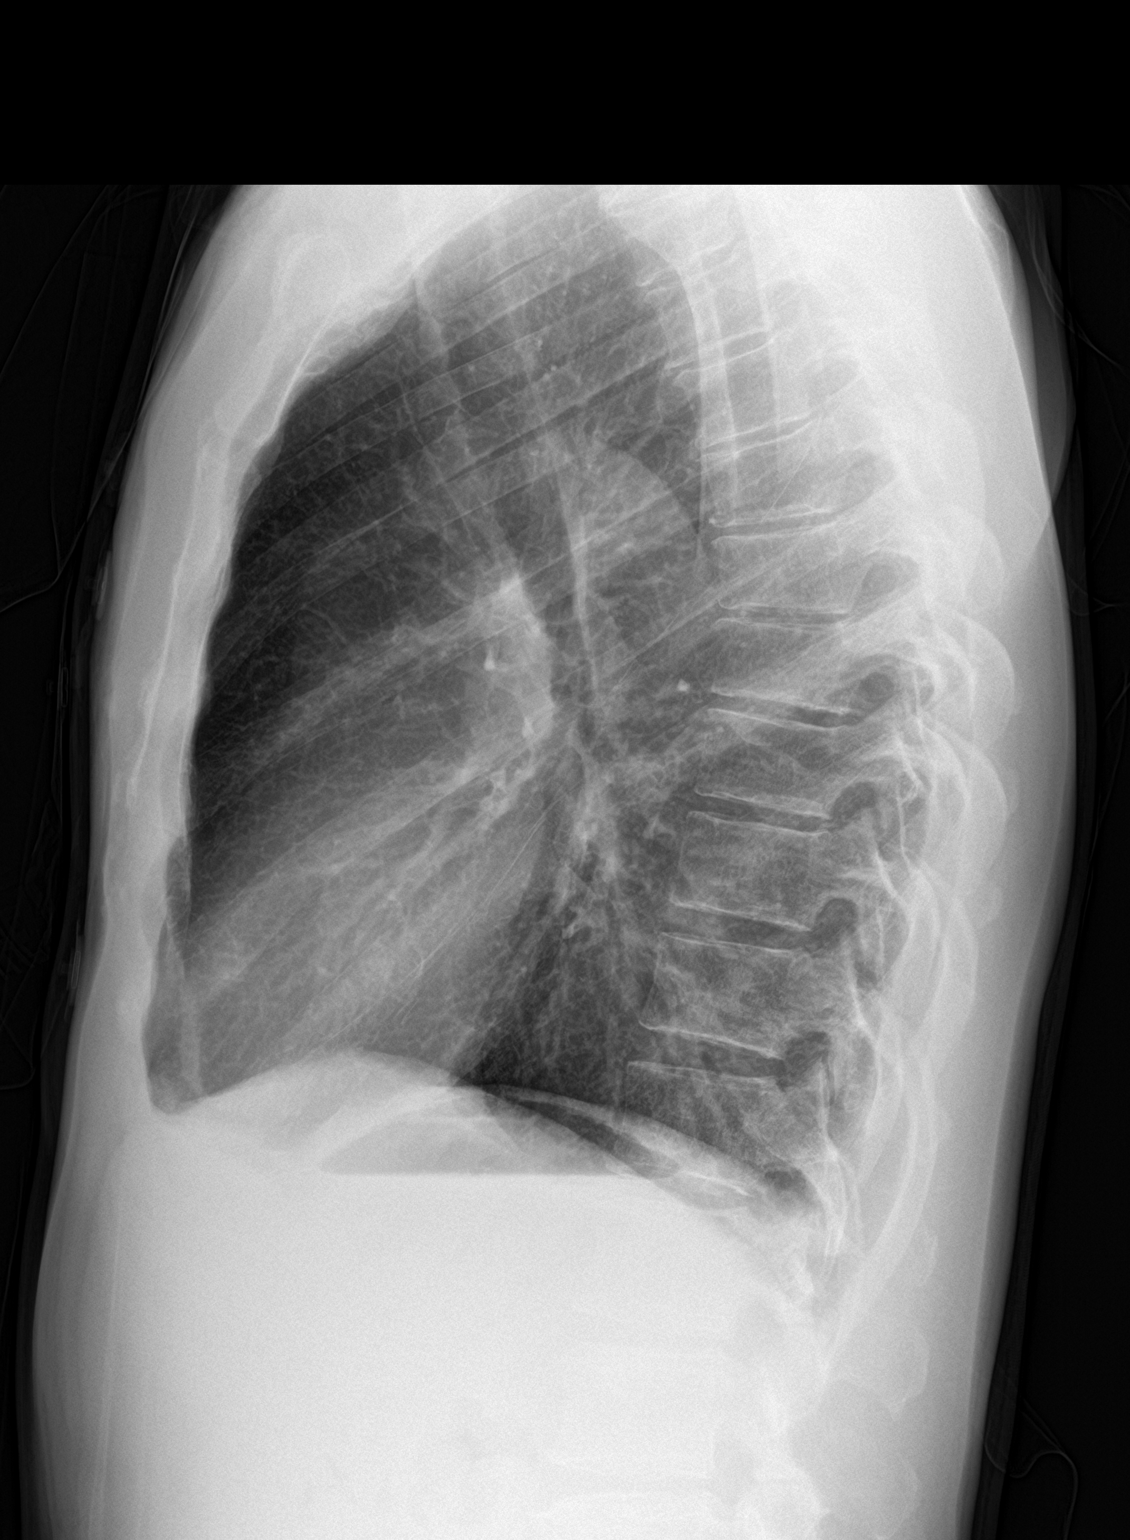

[2 of 2 positions shown; findings below may reference images not displayed]

FINDINGS: The heart size and mediastinal contours are within normal limits.
Both lungs are clear. The visualized skeletal structures are
unremarkable.
IMPRESSION: No active cardiopulmonary disease.

## 2022-07-25 ENCOUNTER — Emergency Department
Admission: EM | Admit: 2022-07-25 | Discharge: 2022-07-25 | Disposition: A | Discharge status: Home / Self Care | Payer: Medicaid Other | Attending: Emergency Medicine | Admitting: Emergency Medicine

## 2022-07-25 ENCOUNTER — Other Ambulatory Visit: Payer: Self-pay

## 2022-07-25 DIAGNOSIS — W228XXA Striking against or struck by other objects, initial encounter: Secondary | ICD-10-CM | POA: Insufficient documentation

## 2022-07-25 DIAGNOSIS — S0502XA Injury of conjunctiva and corneal abrasion without foreign body, left eye, initial encounter: Secondary | ICD-10-CM | POA: Insufficient documentation

## 2022-07-25 DIAGNOSIS — S058X2A Other injuries of left eye and orbit, initial encounter: Secondary | ICD-10-CM

## 2022-07-25 DIAGNOSIS — E119 Type 2 diabetes mellitus without complications: Secondary | ICD-10-CM | POA: Insufficient documentation

## 2022-07-25 MED ORDER — FLUORESCEIN SODIUM 1 MG OP STRP
1.0000 | ORAL_STRIP | Freq: Once | OPHTHALMIC | Status: AC
  Administered 2022-07-25: 1 via OPHTHALMIC
  Filled 2022-07-25: qty 1

## 2022-07-25 MED ORDER — TETRACAINE HCL 0.5 % OP SOLN
2.0000 [drp] | Freq: Once | OPHTHALMIC | Status: AC
  Administered 2022-07-25: 2 [drp] via OPHTHALMIC
  Filled 2022-07-25: qty 4

## 2022-07-25 MED ORDER — MOXIFLOXACIN HCL 0.5 % OP SOLN: 1.0000 [drp] | Freq: Three times a day (TID) | OPHTHALMIC | Status: DC

## 2022-07-25 NOTE — ED Notes (Signed)
Per PA, pt will discharge directly to opthalmology office.

## 2022-07-25 NOTE — ED Provider Notes (Signed)
[facility] Provider Note    Event Date/Time   First MD Initiated Contact with Patient 07/25/22 1204     (approximate)   History   Chief Complaint Eye Problem   HPI Miguel Rasmussen is a 61 y.o. male, history of anxiety, depression, GERD, diabetes, presents emergency department for evaluation of left eye pain.  Patient states that he was weed eating yesterday when he felt something hit him in his eye.  He states that he feels that he can see some plan on the right part of his vision on the left eye.  He attempted to rinse it copiously with saline, however this did not help his symptoms.  When he woke up this morning, he states that his eyelids were "glued shut".  He does not wear contact lenses.  Denies fever/chills, vision loss, headache, hearing changes, dizziness/lightheadedness, myalgias, or rash/lesions.  History Limitations: No limitations.        Physical Exam  Triage Vital Signs: ED Triage Vitals  Enc Vitals Group     BP 07/25/22 1141 (!) 165/77     Pulse Rate 07/25/22 1141 67     Resp 07/25/22 1141 18     Temp 07/25/22 1141 (!) 97.5 F (36.4 C)     Temp Source 07/25/22 1141 Oral     SpO2 07/25/22 1141 94 %     Weight 07/25/22 1140 145 lb 1 oz (65.8 kg)     Height 07/25/22 1140 5\' 8"  (1.727 m)     Head Circumference --      Peak Flow --      Pain Score --      Pain Loc --      Pain Edu? --      Excl. in [place]? --     Most recent vital signs: Vitals:   07/25/22 1141 07/25/22 1420  BP: (!) 165/77 (!) 163/113  Pulse: 67 (!) 59  Resp: 18 16  Temp: (!) 97.5 F (36.4 C) 98.1 F (36.7 C)  SpO2: 94% 99%    General: Awake, NAD.  Skin: Warm, dry. No rashes or lesions.  CV: Good peripheral perfusion.  Resp: Normal effort.  Abd: Soft, non-tender. No distention.  Neuro: At baseline. No gross neurological deficits.  Musculoskeletal: Normal ROM of all extremities.   Focused Exam: PERRL, EOMI, mildly erythematous conjunctivae, no obvious  foreign bodies on eyelid inversion.  Fluorescein examination does show a 1 to 2 mm diameter fluorescein uptake, suspicious for corneal abrasion versus ulcer.  Negative Seidel sign.    Media Information   Physical Exam    ED Results / Procedures / Treatments  Labs (all labs ordered are listed, but only abnormal results are displayed) Labs Reviewed - No data to display   EKG N/A.   RADIOLOGY  ED Provider Interpretation: N/A.  No results found.  PROCEDURES:  Critical Care performed: N/A.  Procedures    MEDICATIONS ORDERED IN ED: Medications  fluorescein ophthalmic strip 1 strip (1 strip Both Eyes Given by Other 07/25/22 1314)  tetracaine (PONTOCAINE) 0.5 % ophthalmic solution 2 drop (2 drops Left Eye Given by Other 07/25/22 1314)     IMPRESSION / MDM / ASSESSMENT AND PLAN / ED COURSE  I reviewed the triage vital signs and the nursing notes.                              Differential diagnosis includes, but is not limited  to, corneal abrasion, corneal ulcer, corneal foreign body, conjunctivitis, scleritis  ED Course N/A.  Assessment/Plan Patient presents with left eye pain following suspected foreign body after brief biking yesterday.  History and physical exam concerning for corneal ulcer versus corneal foreign body.  Spoke with the on-call ophthalmologist, who reviewed the image and states that this was likely a corneal abrasion.  However, he would like to see the patient in clinic today.  I believe this is reasonable.  Attempted to initiate moxifloxacin ophthalmic drops here in the emergency department, as discussed with Dr. George Ina. However our inventory did not stock it at this time.  Alternative agents would prolong his stay here in the ED.  Given that it is close to closing time at [facility], I believe that an early discharge would outweigh the benefits of initiating antibiotic therapy here to ensure the was able to see Dr. Michelene Heady today.  The patient  agreed.  Encouraged him to go directly to [facility] and to have specifically for antibiotics after examination, if needed.  He was agreeable to this plan.  Will discharge.  Provided the patient with anticipatory guidance, return precautions, and educational material. Encouraged the patient to return to the emergency department at any time if they begin to experience any new or worsening symptoms. Patient expressed understanding and agreed with the plan.   Patient's presentation is most consistent with acute complicated illness / injury requiring diagnostic workup.       FINAL CLINICAL IMPRESSION(S) / ED DIAGNOSES   Final diagnoses:  Corneal injury of left eye, initial encounter     Rx / DC Orders   ED Discharge Orders     None        Note:  This document was prepared using Dragon voice recognition software and may include unintentional dictation errors.   Teodoro Spray, [place] 07/25/22 1501    Duffy Bruce, MD 07/29/22 [phone]

## 2022-07-25 NOTE — Discharge Instructions (Addendum)
-  Please report directly to [facility] located on [address], [place], [place] [address].  Do not wait until tomorrow.  -You take Tylenol/ibuprofen as needed for pain.  Please consult Dr. George Ina about antibiotic treatment.

## 2022-07-25 NOTE — ED Triage Notes (Signed)
First Nurse Note:  C/O injuring left eye yesterday while doing yard work.  States maybe a rock or a piece of glass.

## 2022-07-25 NOTE — ED Triage Notes (Signed)
Pt here with left eye problem. Pt was weed eating and states something hit him in the eye and it is very painful. Pt stable in triage.

## 2022-12-06 ENCOUNTER — Other Ambulatory Visit: Payer: Self-pay

## 2022-12-06 ENCOUNTER — Emergency Department
Admission: EM | Admit: 2022-12-06 | Discharge: 2022-12-06 | Disposition: A | Discharge status: Home / Self Care | Payer: Medicaid Other | Attending: Emergency Medicine | Admitting: Emergency Medicine

## 2022-12-06 DIAGNOSIS — L309 Dermatitis, unspecified: Secondary | ICD-10-CM

## 2022-12-06 DIAGNOSIS — E119 Type 2 diabetes mellitus without complications: Secondary | ICD-10-CM | POA: Insufficient documentation

## 2022-12-06 DIAGNOSIS — R21 Rash and other nonspecific skin eruption: Secondary | ICD-10-CM | POA: Insufficient documentation

## 2022-12-06 LAB — CBC WITH DIFFERENTIAL/PLATELET
Abs Immature Granulocytes: 0.02 10*3/uL (ref 0.00–0.07)
Basophils Absolute: 0 10*3/uL (ref 0.0–0.1)
Basophils Relative: 0 %
Eosinophils Absolute: 0.1 10*3/uL (ref 0.0–0.5)
Eosinophils Relative: 1 %
HCT: 43.4 % (ref 39.0–52.0)
Hemoglobin: 14 g/dL (ref 13.0–17.0)
Immature Granulocytes: 0 %
Lymphocytes Relative: 29 %
Lymphs Abs: 1.8 10*3/uL (ref 0.7–4.0)
MCH: 33.4 pg (ref 26.0–34.0)
MCHC: 32.3 g/dL (ref 30.0–36.0)
MCV: 103.6 fL — ABNORMAL HIGH (ref 80.0–100.0)
Monocytes Absolute: 0.6 10*3/uL (ref 0.1–1.0)
Monocytes Relative: 9 %
Neutro Abs: 3.6 10*3/uL (ref 1.7–7.7)
Neutrophils Relative %: 61 %
Platelets: 140 10*3/uL — ABNORMAL LOW (ref 150–400)
RBC: 4.19 MIL/uL — ABNORMAL LOW (ref 4.22–5.81)
RDW: 11.9 % (ref 11.5–15.5)
WBC: 6 10*3/uL (ref 4.0–10.5)
nRBC: 0 % (ref 0.0–0.2)

## 2022-12-06 LAB — COMPREHENSIVE METABOLIC PANEL
ALT: 50 U/L — ABNORMAL HIGH (ref 0–44)
AST: 53 U/L — ABNORMAL HIGH (ref 15–41)
Albumin: 3.5 g/dL (ref 3.5–5.0)
Alkaline Phosphatase: 59 U/L (ref 38–126)
Anion gap: 9 (ref 5–15)
BUN: 13 mg/dL (ref 8–23)
CO2: 27 mmol/L (ref 22–32)
Calcium: 9 mg/dL (ref 8.9–10.3)
Chloride: 104 mmol/L (ref 98–111)
Creatinine, Ser: 0.93 mg/dL (ref 0.61–1.24)
GFR, Estimated: >60 mL/min (ref >60)
Glucose, Bld: 102 mg/dL — ABNORMAL HIGH (ref 70–99)
Potassium: 3.6 mmol/L (ref 3.5–5.1)
Sodium: 140 mmol/L (ref 135–145)
Total Bilirubin: 0.9 mg/dL (ref 0.3–1.2)
Total Protein: 7.1 g/dL (ref 6.5–8.1)

## 2022-12-06 LAB — SEDIMENTATION RATE: Sed Rate: 4 mm/hr (ref 0–20)

## 2022-12-06 MED ORDER — TRIAMCINOLONE ACETONIDE 0.1 % EX OINT: 1.0000 | TOPICAL_OINTMENT | Freq: Two times a day (BID) | CUTANEOUS | 0 refills | Status: DC

## 2022-12-06 MED ORDER — NAPROXEN 250 MG PO TABS: 500.0000 mg | ORAL_TABLET | Freq: Two times a day (BID) | ORAL | 0 refills | Status: AC

## 2022-12-06 MED ORDER — NAPROXEN 500 MG PO TABS
500.0000 mg | ORAL_TABLET | Freq: Once | ORAL | Status: AC
  Administered 2022-12-06: 500 mg via ORAL
  Filled 2022-12-06: qty 1

## 2022-12-06 MED ORDER — OXYCODONE-ACETAMINOPHEN 5-325 MG PO TABS
1.0000 | ORAL_TABLET | Freq: Once | ORAL | Status: AC
  Administered 2022-12-06: 1 via ORAL
  Filled 2022-12-06: qty 1

## 2022-12-06 MED ORDER — PREDNISONE 20 MG PO TABS
60.0000 mg | ORAL_TABLET | Freq: Once | ORAL | Status: AC
  Administered 2022-12-06: 60 mg via ORAL
  Filled 2022-12-06: qty 3

## 2022-12-06 NOTE — ED Provider Notes (Signed)
[facility] Provider Note    Event Date/Time   First MD Initiated Contact with Patient 12/06/22 1223     (approximate)   History   Chief Complaint: Allergic Reaction   HPI  Miguel Rasmussen is a 62 y.o. male with a history of GERD, diabetes, alcohol abuse, eczema who comes ED complaining of itchy rash on his left ankle, bilateral elbows, and on his face.  He also complains of swollen lymph nodes under his chin.  Reviewing electronic medical records, this is all chronic pain.  He has been treated for eczema in the past.  He has had biopsies of swollen submandibular lymph nodes in the past.  He denies fever chest pain or shortness of breath.  No difficulty breathing or swallowing.  He saw his primary care doctor several days ago and was given Keflex which he complains has not helped.     Physical Exam   Triage Vital Signs: ED Triage Vitals  Enc Vitals Group     BP 12/06/22 1212 (!) 171/84     Pulse Rate 12/06/22 1212 85     Resp 12/06/22 1212 (!) 24     Temp 12/06/22 1212 98.4 F (36.9 C)     Temp src --      SpO2 12/06/22 1212 100 %     Weight 12/06/22 1210 140 lb (63.5 kg)     Height 12/06/22 1210 5\' 8"  (1.727 m)     Head Circumference --      Peak Flow --      Pain Score 12/06/22 1209 10     Pain Loc --      Pain Edu? --      Excl. in [place]? --     Most recent vital signs: Vitals:   12/06/22 1430 12/06/22 1500  BP: (!) 162/88 (!) 149/82  Pulse: 67 63  Resp: 15 16  Temp:    SpO2: 99% 97%    General: Awake, no distress.  CV:  Good peripheral perfusion.  Regular rate and rhythm Resp:  Normal effort.  Clear to auscultation bilaterally Abd:  No distention.  Soft nontender Other:  No intraoral lesions or swelling.  No floor mouth elevation or tongue edema.  Uvula normal.  Oropharynx normal.  Moist oral mucosa.   ED Results / Procedures / Treatments   Labs (all labs ordered are listed, but only abnormal results are displayed) Labs  Reviewed  COMPREHENSIVE METABOLIC PANEL - Abnormal; Notable for the following components:      Result Value   Glucose, Bld 102 (*)    AST 53 (*)    ALT 50 (*)    All other components within normal limits  CBC WITH DIFFERENTIAL/PLATELET - Abnormal; Notable for the following components:   RBC 4.19 (*)    MCV 103.6 (*)    Platelets 140 (*)    All other components within normal limits  SEDIMENTATION RATE     EKG    RADIOLOGY    PROCEDURES:  Procedures   MEDICATIONS ORDERED IN ED: Medications  oxyCODONE-acetaminophen (PERCOCET/ROXICET) 5-325 MG per tablet 1 tablet (1 tablet Oral Given 12/06/22 1303)  predniSONE (DELTASONE) tablet 60 mg (60 mg Oral Given 12/06/22 1303)  naproxen (NAPROSYN) tablet 500 mg (500 mg Oral Given 12/06/22 1303)     IMPRESSION / MDM / ASSESSMENT AND PLAN / ED COURSE  I reviewed the triage vital signs and the nursing notes.  DDx: Eczema, pityriasis rosea, contact dermatitis.  Doubt cellulitis,  drug rash, allergic reaction  Patient's presentation is most consistent with exacerbation of chronic illness.  Patient presents with what appears to be eczematous rash.  He complains of severe pain.  Will give naproxen and Percocet and prednisone in the ED, prescribed triamcinolone.       FINAL CLINICAL IMPRESSION(S) / ED DIAGNOSES   Final diagnoses:  Rash     Rx / DC Orders   ED Discharge Orders          Ordered    triamcinolone ointment (KENALOG) 0.1 %  2 times daily        12/06/22 1505             Note:  This document was prepared using Dragon voice recognition software and may include unintentional dictation errors.   Carrie Mew, MD 12/06/22 [phone]

## 2022-12-06 NOTE — Discharge Instructions (Addendum)
Apply the steroid ointment twice a day to the areas of rash.  Use a thick moisturizer such as Aquaphor or Eucerin cream/ointment as well to maximize skin hydration.

## 2022-12-06 NOTE — ED Notes (Signed)
Pt via POV from home. Pt here c/o of a rash to the L ankle, states that he went to his PCP for this rash and they told him that it was possibly an infection so then he was prescribed abx but states that he thinks he is having an allergic reaction to the medication. States he stopped taking the abx a couple days ago. Pt states the ankle is now swollen and painful. Pt is A&Ox4 and NAD

## 2022-12-06 NOTE — ED Triage Notes (Signed)
Pt states coming in with what he thinks is an allergic reaction to cephalexin. Pt states he had oozing hives from it. Pt states he stopped taking it a few days ago, but this is the earliest he could come to the hospital.

## 2023-01-28 ENCOUNTER — Encounter: Payer: Self-pay | Admitting: Dermatology

## 2023-01-28 ENCOUNTER — Ambulatory Visit (INDEPENDENT_AMBULATORY_CARE_PROVIDER_SITE_OTHER): Payer: Medicaid Other | Admitting: Dermatology

## 2023-01-28 VITALS — BP 121/73 | HR 69

## 2023-01-28 DIAGNOSIS — L280 Lichen simplex chronicus: Secondary | ICD-10-CM | POA: Diagnosis not present

## 2023-01-28 DIAGNOSIS — L309 Dermatitis, unspecified: Secondary | ICD-10-CM

## 2023-01-28 MED ORDER — TRIAMCINOLONE ACETONIDE 0.1 % EX CREA: 1.0000 | TOPICAL_CREAM | CUTANEOUS | 1 refills | Status: DC

## 2023-01-28 MED ORDER — CLOBETASOL PROPIONATE 0.05 % EX OINT: 1.0000 | TOPICAL_OINTMENT | CUTANEOUS | 1 refills | Status: DC

## 2023-01-28 NOTE — Patient Instructions (Signed)
Due to recent changes in healthcare laws, you may see results of your pathology and/or laboratory studies on MyChart before the doctors have had a chance to review them. We understand that in some cases there may be results that are confusing or concerning to you. Please understand that not all results are received at the same time and often the doctors may need to interpret multiple results in order to provide you with the best plan of care or course of treatment. Therefore, we ask that you please give us 2 business days to thoroughly review all your results before contacting the office for clarification. Should we see a critical lab result, you will be contacted sooner.   If You Need Anything After Your Visit  If you have any questions or concerns for your doctor, please call our main line at [phone] and press option 4 to reach your doctor's medical assistant. If no one answers, please leave a voicemail as directed and we will return your call as soon as possible. Messages left after 4 pm will be answered the following business day.   You may also send us a message via MyChart. We typically respond to MyChart messages within 1-2 business days.  For prescription refills, please ask your pharmacy to contact our office. Our fax number is [phone].  If you have an urgent issue when the clinic is closed that cannot wait until the next business day, you can page your doctor at the number below.    Please note that while we do our best to be available for urgent issues outside of office hours, we are not available 24/7.   If you have an urgent issue and are unable to reach us, you may choose to seek medical care at your doctor's office, retail clinic, urgent care center, or emergency room.  If you have a medical emergency, please immediately call 911 or go to the emergency department.  Pager Numbers  - Dr. Kowalski: [phone]  - Dr. Moye: [phone]  - Dr. Stewart:  [phone]  In the event of inclement weather, please call our main line at [phone] for an update on the status of any delays or closures.  Dermatology Medication Tips: Please keep the boxes that topical medications come in in order to help keep track of the instructions about where and how to use these. Pharmacies typically print the medication instructions only on the boxes and not directly on the medication tubes.   If your medication is too expensive, please contact our office at [phone] option 4 or send us a message through MyChart.   We are unable to tell what your co-pay for medications will be in advance as this is different depending on your insurance coverage. However, we may be able to find a substitute medication at lower cost or fill out paperwork to get insurance to cover a needed medication.   If a prior authorization is required to get your medication covered by your insurance company, please allow us 1-2 business days to complete this process.  Drug prices often vary depending on where the prescription is filled and some pharmacies may offer cheaper prices.  The website www.goodrx.com contains coupons for medications through different pharmacies. The prices here do not account for what the cost may be with help from insurance (it may be cheaper with your insurance), but the website can give you the price if you did not use any insurance.  - You can print the associated coupon and take it with   your prescription to the pharmacy.  - You may also stop by our office during regular business hours and pick up a GoodRx coupon card.  - If you need your prescription sent electronically to a different pharmacy, notify our office through [facility] MyChart or by phone at [phone] option 4.     Si Usted Necesita Algo Despus de Su Visita  Tambin puede enviarnos un mensaje a travs de MyChart. Por lo general respondemos a los mensajes de MyChart en el transcurso de 1 a 2  das hbiles.  Para renovar recetas, por favor pida a su farmacia que se ponga en contacto con nuestra oficina. Nuestro nmero de fax es el [phone].  Si tiene un asunto urgente cuando la clnica est cerrada y que no puede esperar hasta el siguiente da hbil, puede llamar/localizar a su doctor(a) al nmero que aparece a continuacin.   Por favor, tenga en cuenta que aunque hacemos todo lo posible para estar disponibles para asuntos urgentes fuera del horario de oficina, no estamos disponibles las 24 horas del da, los 7 das de la semana.   Si tiene un problema urgente y no puede comunicarse con nosotros, puede optar por buscar atencin mdica  en el consultorio de su doctor(a), en una clnica privada, en un centro de atencin urgente o en una sala de emergencias.  Si tiene una emergencia mdica, por favor llame inmediatamente al 911 o vaya a la sala de emergencias.  Nmeros de bper  - Dr. Kowalski: [phone]  - Dra. Moye: [phone]  - Dra. Stewart: [phone]  En caso de inclemencias del tiempo, por favor llame a nuestra lnea principal al [phone] para una actualizacin sobre el estado de cualquier retraso o cierre.  Consejos para la medicacin en dermatologa: Por favor, guarde las cajas en las que vienen los medicamentos de uso tpico para ayudarle a seguir las instrucciones sobre dnde y cmo usarlos. Las farmacias generalmente imprimen las instrucciones del medicamento slo en las cajas y no directamente en los tubos del medicamento.   Si su medicamento es muy caro, por favor, pngase en contacto con nuestra oficina llamando al [phone] y presione la opcin 4 o envenos un mensaje a travs de MyChart.   No podemos decirle cul ser su copago por los medicamentos por adelantado ya que esto es diferente dependiendo de la cobertura de su seguro. Sin embargo, es posible que podamos encontrar un medicamento sustituto a menor costo o llenar un formulario para que el  seguro cubra el medicamento que se considera necesario.   Si se requiere una autorizacin previa para que su compaa de seguros cubra su medicamento, por favor permtanos de 1 a 2 das hbiles para completar este proceso.  Los precios de los medicamentos varan con frecuencia dependiendo del lugar de dnde se surte la receta y alguna farmacias pueden ofrecer precios ms baratos.  El sitio web www.goodrx.com tiene cupones para medicamentos de diferentes farmacias. Los precios aqu no tienen en cuenta lo que podra costar con la ayuda del seguro (puede ser ms barato con su seguro), pero el sitio web puede darle el precio si no utiliz ningn seguro.  - Puede imprimir el cupn correspondiente y llevarlo con su receta a la farmacia.  - Tambin puede pasar por nuestra oficina durante el horario de atencin regular y recoger una tarjeta de cupones de GoodRx.  - Si necesita que su receta se enve electrnicamente a una farmacia diferente, informe a nuestra oficina a travs de MyChart de [place]   o por telfono llamando al [phone] y presione la opcin 4.  

## 2023-01-28 NOTE — Progress Notes (Signed)
   New Patient Visit   Subjective  Miguel Rasmussen is a 62 y.o. male who presents for the following: Rash, pt took abx in 11/2022 and broke out all over, Pt started [facility] 0.1% oint bid 02/24 which has helped but still has rash on hands, chronic eczema on L leg, TMC 0.1% oint bid also helping but not clear.  Itchy.   The following portions of the chart were reviewed this encounter and updated as appropriate: medications, allergies, medical history  Review of Systems:  No other skin or systemic complaints except as noted in HPI or Assessment and Plan.  Objective  Well appearing patient in no apparent distress; mood and affect are within normal limits.   A focused examination was performed of the following areas: Hands, left lower leg    Assessment & Plan   Lichen Simplex Chronicus L lower leg Exam: hyperpigmented lichenified plaques   Chronic and persistent condition with duration or expected duration over one year. Condition is bothersome/symptomatic for patient. Currently flared.   Treatment Plan:  Start Clobetasol ointment qd to bid aa legs until itchy rash improved, then change to TMC 0.1% cr qd to bid aa left lower leg until clear, then prn flares, avoid f/g/a   Topical steroids (such as triamcinolone, fluocinolone, fluocinonide, mometasone, clobetasol, halobetasol, betamethasone, hydrocortisone) can cause thinning and lightening of the skin if they are used for too long in the same area. Your physician has selected the right strength medicine for your problem and area affected on the body. Please use your medication only as directed by your physician to prevent side effects.     Hand Dermatitis, secondary to recent drug reaction Hands, feet Exam: dried vesicles palms with hyperkeratosis fingers  Treatment Plan: Start Clobetasol ointment qd to bid aa hands, feet x 2 weeks, then d/c and start Glasco 0.1% cr qd/bid until rash cleared on hands, then prn flares  Recommend mild soap  and moisturizer   Topical steroids (such as triamcinolone, fluocinolone, fluocinonide, mometasone, clobetasol, halobetasol, betamethasone, hydrocortisone) can cause thinning and lightening of the skin if they are used for too long in the same area. Your physician has selected the right strength medicine for your problem and area affected on the body. Please use your medication only as directed by your physician to prevent side effects.    Return in about 2 months (around 03/30/2023).  I, Othelia Pulling, RMA, am acting as scribe for Brendolyn Patty, MD .   Documentation: I have reviewed the above documentation for accuracy and completeness, and I agree with the above.  Brendolyn Patty, MD

## 2023-04-09 ENCOUNTER — Ambulatory Visit: Payer: Medicaid Other | Admitting: Dermatology

## 2023-04-30 ENCOUNTER — Other Ambulatory Visit: Payer: Self-pay | Admitting: Physician Assistant

## 2023-04-30 DIAGNOSIS — R0602 Shortness of breath: Secondary | ICD-10-CM

## 2024-01-08 ENCOUNTER — Ambulatory Visit: Payer: Medicaid Other | Admitting: Dermatology

## 2024-05-13 ENCOUNTER — Emergency Department
Admission: EM | Admit: 2024-05-13 | Discharge: 2024-05-13 | Discharge status: Against Medical Advice | Source: Ambulatory Visit

## 2024-05-13 NOTE — ED Notes (Signed)
 Stroke symptoms started X2 days ago. Patients pcp sent today for stroke workup

## 2024-05-13 NOTE — ED Notes (Addendum)
 Patient visualized leaving ER and heard talking on the phone saying, I am going to [place]

## 2024-10-27 ENCOUNTER — Other Ambulatory Visit: Payer: Self-pay

## 2024-10-27 ENCOUNTER — Emergency Department
Admission: EM | Admit: 2024-10-27 | Discharge: 2024-10-27 | Discharge status: Against Medical Advice | Attending: Emergency Medicine | Admitting: Emergency Medicine

## 2024-10-27 DIAGNOSIS — R21 Rash and other nonspecific skin eruption: Secondary | ICD-10-CM | POA: Insufficient documentation

## 2024-10-27 DIAGNOSIS — Z5321 Procedure and treatment not carried out due to patient leaving prior to being seen by health care provider: Secondary | ICD-10-CM | POA: Insufficient documentation

## 2024-10-27 NOTE — ED Triage Notes (Signed)
 Pt comes with c/o rash. The patient states poison oak all over. Pt states itchy and painful.

## 2024-10-28 ENCOUNTER — Emergency Department
Admission: EM | Admit: 2024-10-28 | Discharge: 2024-10-28 | Disposition: A | Discharge status: Home / Self Care | Attending: Emergency Medicine | Admitting: Emergency Medicine

## 2024-10-28 ENCOUNTER — Encounter: Payer: Self-pay | Admitting: Emergency Medicine

## 2024-10-28 ENCOUNTER — Other Ambulatory Visit: Payer: Self-pay

## 2024-10-28 DIAGNOSIS — R21 Rash and other nonspecific skin eruption: Secondary | ICD-10-CM | POA: Diagnosis present

## 2024-10-28 DIAGNOSIS — L259 Unspecified contact dermatitis, unspecified cause: Secondary | ICD-10-CM | POA: Diagnosis not present

## 2024-10-28 MED ORDER — PREDNISONE 20 MG PO TABS: 40.0000 mg | ORAL_TABLET | Freq: Every day | ORAL | 0 refills | Status: AC

## 2024-10-28 MED ORDER — PREDNISONE 20 MG PO TABS
40.0000 mg | ORAL_TABLET | Freq: Once | ORAL | Status: AC
  Administered 2024-10-28: 40 mg via ORAL
  Filled 2024-10-28: qty 2

## 2024-10-28 NOTE — ED Triage Notes (Signed)
 Patient states he has poison oak, ongoing since Friday.  States he is highly allergic and always needs medication to resolve.  Ambulatory to triage, NAD noted at this time.

## 2024-10-28 NOTE — Discharge Instructions (Signed)
 Please take the medication as prescribed.  Please follow-up with your outpatient provider.  Return for any new, worsening, or changing symptoms or other concerns.  It was a pleasure caring for you today.

## 2024-10-28 NOTE — ED Provider Notes (Signed)
 "  [facility] Provider Note    Event Date/Time   First MD Initiated Contact with Patient 10/28/24 1444     (approximate)   History   Poison Ivy   HPI  Miguel Rasmussen is a 63 y.o. male who presents today for evaluation of rash.  Patient reports that he was cutting open a wall with a chainsaw and did not know that there was poison ivy in the wall and he got poison ivy on him.  He reports that he has itchiness to his legs and face.  Reports this has happened to him multiple times in the past and he gets prednisone  to heal him from the inside out.  No difficulty breathing or swallowing.  There are no active problems to display for this patient.         Physical Exam   Triage Vital Signs: ED Triage Vitals  Encounter Vitals Group     BP 10/28/24 1432 (!) 143/94     Girls Systolic BP Percentile --      Girls Diastolic BP Percentile --      Boys Systolic BP Percentile --      Boys Diastolic BP Percentile --      Pulse Rate 10/28/24 1432 84     Resp 10/28/24 1432 16     Temp 10/28/24 1432 98.4 F (36.9 C)     Temp Source 10/28/24 1432 Oral     SpO2 10/28/24 1432 97 %     Weight 10/28/24 1434 140 lb (63.5 kg)     Height 10/28/24 1434 5' 8 (1.727 m)     Head Circumference --      Peak Flow --      Pain Score 10/28/24 1433 8     Pain Loc --      Pain Education --      Exclude from Growth Chart --     Most recent vital signs: Vitals:   10/28/24 1432  BP: (!) 143/94  Pulse: 84  Resp: 16  Temp: 98.4 F (36.9 C)  SpO2: 97%    Physical Exam Vitals and nursing note reviewed.  Constitutional:      General: Awake and alert. No acute distress.    Appearance: Normal appearance. The patient is normal weight.  HENT:     Head: Normocephalic and atraumatic.     Mouth: Mucous membranes are moist.  Eyes:     General: PERRL. Normal EOMs        Right eye: No discharge.        Left eye: No discharge.     Conjunctiva/sclera: Conjunctivae normal.   Cardiovascular:     Rate and Rhythm: Normal rate and regular rhythm.     Pulses: Normal pulses.  Pulmonary:     Effort: Pulmonary effort is normal. No respiratory distress.     Breath sounds: Normal breath sounds.  Abdominal:     Abdomen is soft. There is no abdominal tenderness. No rebound or guarding. No distention. Musculoskeletal:        General: No swelling. Normal range of motion.     Cervical back: Normal range of motion and neck supple.  Skin:    General: Skin is warm and dry.     Capillary Refill: Capillary refill takes less than 2 seconds.     Findings: Thickened skin to lateral left ankle, elbows, and bilateral cheeks without overlying/surrounding erythema, no drainage. Neurological:     Mental Status: The patient is awake  and alert.      ED Results / Procedures / Treatments   Labs (all labs ordered are listed, but only abnormal results are displayed) Labs Reviewed - No data to display   EKG     RADIOLOGY     PROCEDURES:  Critical Care performed:   Procedures   MEDICATIONS ORDERED IN ED: Medications  predniSONE  (DELTASONE ) tablet 40 mg (has no administration in time range)     IMPRESSION / MDM / ASSESSMENT AND PLAN / ED COURSE  I reviewed the triage vital signs and the nursing notes.   Differential diagnosis includes, but is not limited to, contact dermatitis, eczema, doubt cellulitis.  I reviewed the patient's chart.  Patient has been seen for this complaint multiple times in the past.  Most recently to the emergency department yesterday.  Patient reports that this is the same as when he had poison oak/poison ivy and is requesting prednisone .  First dose was given to him in the emergency department.  Prescription sent to his pharmacy.  There is no evidence of superimposed infection at this time.  We discussed symptomatic management and return precautions.  Patient understands and agrees with plan.  Discharged in stable condition.  Patient's  presentation is most consistent with acute complicated illness / injury requiring diagnostic workup.    FINAL CLINICAL IMPRESSION(S) / ED DIAGNOSES   Final diagnoses:  Contact dermatitis, unspecified contact dermatitis type, unspecified trigger     Rx / DC Orders   ED Discharge Orders          Ordered    predniSONE  (DELTASONE ) 20 MG tablet  Daily with breakfast        10/28/24 1513             Note:  This document was prepared using Dragon voice recognition software and may include unintentional dictation errors.   Kambrie Eddleman E, PA-C 10/28/24 1551  "

## 2024-11-14 ENCOUNTER — Other Ambulatory Visit: Payer: Self-pay

## 2024-11-14 ENCOUNTER — Emergency Department: Admission: EM | Admit: 2024-11-14 | Discharge: 2024-11-14 | Disposition: A | Discharge status: Home / Self Care

## 2024-11-14 DIAGNOSIS — R21 Rash and other nonspecific skin eruption: Secondary | ICD-10-CM | POA: Diagnosis present

## 2024-11-14 DIAGNOSIS — L280 Lichen simplex chronicus: Secondary | ICD-10-CM | POA: Insufficient documentation

## 2024-11-14 DIAGNOSIS — E119 Type 2 diabetes mellitus without complications: Secondary | ICD-10-CM | POA: Insufficient documentation

## 2024-11-14 LAB — CBC WITH DIFFERENTIAL/PLATELET
Abs Immature Granulocytes: 0.07 K/uL (ref 0.00–0.07)
Basophils Absolute: 0 K/uL (ref 0.0–0.1)
Basophils Relative: 1 %
Eosinophils Absolute: 0 K/uL (ref 0.0–0.5)
Eosinophils Relative: 0 %
HCT: 44.1 % (ref 39.0–52.0)
Hemoglobin: 14.2 g/dL (ref 13.0–17.0)
Immature Granulocytes: 1 %
Lymphocytes Relative: 25 %
Lymphs Abs: 2.1 K/uL (ref 0.7–4.0)
MCH: 33.3 pg (ref 26.0–34.0)
MCHC: 32.2 g/dL (ref 30.0–36.0)
MCV: 103.3 fL — ABNORMAL HIGH (ref 80.0–100.0)
Monocytes Absolute: 0.8 K/uL (ref 0.1–1.0)
Monocytes Relative: 10 %
Neutro Abs: 5.2 K/uL (ref 1.7–7.7)
Neutrophils Relative %: 63 %
Platelets: 120 K/uL — ABNORMAL LOW (ref 150–400)
RBC: 4.27 MIL/uL (ref 4.22–5.81)
RDW: 12.3 % (ref 11.5–15.5)
WBC: 8.2 K/uL (ref 4.0–10.5)
nRBC: 0 % (ref 0.0–0.2)

## 2024-11-14 LAB — COMPREHENSIVE METABOLIC PANEL WITH GFR
ALT: 58 U/L — ABNORMAL HIGH (ref 0–44)
AST: 48 U/L — ABNORMAL HIGH (ref 15–41)
Albumin: 4.2 g/dL (ref 3.5–5.0)
Alkaline Phosphatase: 83 U/L (ref 38–126)
Anion gap: 10 (ref 5–15)
BUN: 12 mg/dL (ref 8–23)
CO2: 23 mmol/L (ref 22–32)
Calcium: 9 mg/dL (ref 8.9–10.3)
Chloride: 104 mmol/L (ref 98–111)
Creatinine, Ser: 1.1 mg/dL (ref 0.61–1.24)
GFR, Estimated: >60 mL/min
Glucose, Bld: 106 mg/dL — ABNORMAL HIGH (ref 70–99)
Potassium: 4.2 mmol/L (ref 3.5–5.1)
Sodium: 137 mmol/L (ref 135–145)
Total Bilirubin: 0.6 mg/dL (ref 0.0–1.2)
Total Protein: 7.4 g/dL (ref 6.5–8.1)

## 2024-11-14 MED ORDER — DIPHENHYDRAMINE HCL 50 MG/ML IJ SOLN
12.5000 mg | Freq: Once | INTRAMUSCULAR | Status: AC
  Administered 2024-11-14: 12.5 mg via INTRAVENOUS
  Filled 2024-11-14: qty 1

## 2024-11-14 MED ORDER — TRIAMCINOLONE ACETONIDE 0.1 % EX CREA: 1.0000 | TOPICAL_CREAM | CUTANEOUS | 1 refills | Status: AC

## 2024-11-14 MED ORDER — CLOBETASOL PROPIONATE 0.05 % EX OINT: 1.0000 | TOPICAL_OINTMENT | CUTANEOUS | 1 refills | Status: AC

## 2024-11-14 MED ORDER — DEXAMETHASONE SOD PHOSPHATE PF 10 MG/ML IJ SOLN
10.0000 mg | Freq: Once | INTRAMUSCULAR | Status: AC
  Administered 2024-11-14: 10 mg via INTRAVENOUS
  Filled 2024-11-14: qty 1

## 2024-11-14 MED ORDER — SODIUM CHLORIDE 0.9 % IV BOLUS
1000.0000 mL | Freq: Once | INTRAVENOUS | Status: AC
  Administered 2024-11-14: 1000 mL via INTRAVENOUS

## 2024-11-14 MED ORDER — HYDROXYZINE HCL 25 MG PO TABS: 25.0000 mg | ORAL_TABLET | Freq: Three times a day (TID) | ORAL | 0 refills | Status: AC | PRN

## 2024-11-14 NOTE — Discharge Instructions (Addendum)
 Please use the creams as prescribed.  You may take the hydroxyzine  to help with itching.  Do not take Benadryl  in addition to the hydroxyzine .  Continue the clindamycin  as prescribed until finished.  Please call and schedule follow-up appointment with dermatology.  Return to the emergency department for symptoms change, worsen, or for new concerns.

## 2024-11-14 NOTE — ED Provider Notes (Signed)
 "  [facility] Provider Note    Event Date/Time   First MD Initiated Contact with Patient 11/14/24 [phone]     (approximate)   History   Rash   HPI  Miguel Rasmussen is a 64 y.o. male  with history of hepatitis, diabetes, GERD, hepatitis C, lichen simplex chronicus and as listed in EMR presents to the emergency department for treatment and evaluation of burning, pruritic rash on his face, arms, torso, buttocks, and lower extremities.  He was treated here for the same on 10/28/2024.  He also followed up with his primary care provider who put him on clindamycin  and mupirocin for concern of MRSA or secondary infection. No relief. He has been unable to sleep for the last 6 days due to the intense itching and burning.     Physical Exam    Vitals:   11/14/24 0833 11/14/24 0940  BP: 123/86   Pulse: 81   Resp: 18   Temp: 98.2 F (36.8 C)   SpO2: 98% 98%    General: Awake, no distress.  CV:  Good peripheral perfusion.  Resp:  Normal effort.  Abd:  No distention.  Other:  Excoriated rash over beard area of face, left lower extremity, lower back, buttocks, chest, and left forearm--difficult to describe due to excoriation and time since onset. No vesicles noted. Rash is dry and scabbing in areas. Skin thickening noted in left lower leg.   ED Results / Procedures / Treatments   Labs (all labs ordered are listed, but only abnormal results are displayed)  Labs Reviewed  CBC WITH DIFFERENTIAL/PLATELET - Abnormal; Notable for the following components:      Result Value   MCV 103.3 (*)    Platelets 120 (*)    All other components within normal limits  COMPREHENSIVE METABOLIC PANEL WITH GFR - Abnormal; Notable for the following components:   Glucose, Bld 106 (*)    AST 48 (*)    ALT 58 (*)    All other components within normal limits     EKG  Not indicated.   RADIOLOGY  Image and radiology report reviewed and interpreted by me. Radiology report  consistent with the same.  Not indicated.  PROCEDURES:  Critical Care performed: No  Procedures   MEDICATIONS ORDERED IN ED:  Medications  sodium chloride  0.9 % bolus 1,000 mL (0 mLs Intravenous Stopped 11/14/24 1147)  diphenhydrAMINE  (BENADRYL ) injection 12.5 mg (12.5 mg Intravenous Given 11/14/24 1004)  dexamethasone  (DECADRON ) injection 10 mg (10 mg Intravenous Given 11/14/24 1119)     IMPRESSION / MDM / ASSESSMENT AND PLAN / ED COURSE   I have reviewed the triage note and vital signs. Vital signs normal   Differential diagnosis includes, but is not limited to, contact dermatitis, atopic dermatitis, neurodermatitis, scabies  Patient's presentation is most consistent with acute complicated illness / injury requiring diagnostic workup.  64 year old male presents to the ER for acute on chronic rash. See HPI.  Based on outside chart review, he was evaluated by dermatology and diagnosed with neurodermatitis.  Prescriptions for clobetasol  and triamcinolone  were written at that time.    Lab studies today are overall reassuring.  No leukocytosis.  AST and ALT are slightly elevated but appears to be at patient's baseline based on record review.  Results were discussed with the patient.  He states that the medications prescribed by the dermatologist did provide significant relief.  Clobetasol  and triamcinolone  were submitted to the patient's pharmacy.  He  was also written a prescription for hydroxyzine  which should help with the itching and any anxiety component to his symptoms.  This may also help him sleep.  He was advised to take 1 prior to trying to go to bed.  He was also encouraged to call and schedule a follow-up appointment with dermatology especially if his symptoms are not improving with medication.  Prior to discharge today, 10 mg of Decadron  was given via IV.  This will hopefully help with the itching as well.     FINAL CLINICAL IMPRESSION(S) / ED DIAGNOSES   Final  diagnoses:  Lichen simplex chronicus     Rx / DC Orders   ED Discharge Orders          Ordered    clobetasol  ointment (TEMOVATE ) 0.05 %  As directed        11/14/24 1052    triamcinolone  cream (KENALOG ) 0.1 %  As directed        11/14/24 1052    hydrOXYzine  (ATARAX ) 25 MG tablet  3 times daily PRN        11/14/24 1052             Note:  This document was prepared using Dragon voice recognition software and may include unintentional dictation errors.   [name], FNP 11/14/24 1239    [name], MD 11/14/24 1524  "

## 2024-11-14 NOTE — ED Triage Notes (Addendum)
 First nurse note: Pt to ED via POV from home. Pt ambulatory to triage. Pt reports sores all over his body and concerned for MRSA. Pt seen on 12/24 here and dx with contact dermatitis after poison ivy exposure.

## 2024-11-20 ENCOUNTER — Emergency Department: Admission: EM | Admit: 2024-11-20 | Discharge: 2024-11-20 | Disposition: A | Discharge status: Home / Self Care

## 2024-11-20 ENCOUNTER — Other Ambulatory Visit: Payer: Self-pay

## 2024-11-20 DIAGNOSIS — R21 Rash and other nonspecific skin eruption: Secondary | ICD-10-CM | POA: Insufficient documentation

## 2024-11-20 DIAGNOSIS — H5789 Other specified disorders of eye and adnexa: Secondary | ICD-10-CM | POA: Diagnosis not present

## 2024-11-20 DIAGNOSIS — E119 Type 2 diabetes mellitus without complications: Secondary | ICD-10-CM | POA: Insufficient documentation

## 2024-11-20 LAB — CBC WITH DIFFERENTIAL/PLATELET
Abs Immature Granulocytes: 0.02 K/uL (ref 0.00–0.07)
Basophils Absolute: 0 K/uL (ref 0.0–0.1)
Basophils Relative: 1 %
Eosinophils Absolute: 0.2 K/uL (ref 0.0–0.5)
Eosinophils Relative: 2 %
HCT: 43.8 % (ref 39.0–52.0)
Hemoglobin: 13.9 g/dL (ref 13.0–17.0)
Immature Granulocytes: 0 %
Lymphocytes Relative: 31 %
Lymphs Abs: 2.1 K/uL (ref 0.7–4.0)
MCH: 33.9 pg (ref 26.0–34.0)
MCHC: 31.7 g/dL (ref 30.0–36.0)
MCV: 106.8 fL — ABNORMAL HIGH (ref 80.0–100.0)
Monocytes Absolute: 0.7 K/uL (ref 0.1–1.0)
Monocytes Relative: 11 %
Neutro Abs: 3.8 K/uL (ref 1.7–7.7)
Neutrophils Relative %: 55 %
Platelets: 126 K/uL — ABNORMAL LOW (ref 150–400)
RBC: 4.1 MIL/uL — ABNORMAL LOW (ref 4.22–5.81)
RDW: 12.1 % (ref 11.5–15.5)
WBC: 6.8 K/uL (ref 4.0–10.5)
nRBC: 0 % (ref 0.0–0.2)

## 2024-11-20 LAB — COMPREHENSIVE METABOLIC PANEL WITH GFR
ALT: 56 U/L — ABNORMAL HIGH (ref 0–44)
AST: 51 U/L — ABNORMAL HIGH (ref 15–41)
Albumin: 4 g/dL (ref 3.5–5.0)
Alkaline Phosphatase: 79 U/L (ref 38–126)
Anion gap: 10 (ref 5–15)
BUN: 23 mg/dL (ref 8–23)
CO2: 25 mmol/L (ref 22–32)
Calcium: 9.2 mg/dL (ref 8.9–10.3)
Chloride: 105 mmol/L (ref 98–111)
Creatinine, Ser: 0.74 mg/dL (ref 0.61–1.24)
GFR, Estimated: >60 mL/min
Glucose, Bld: 94 mg/dL (ref 70–99)
Potassium: 4.4 mmol/L (ref 3.5–5.1)
Sodium: 140 mmol/L (ref 135–145)
Total Bilirubin: 0.3 mg/dL (ref 0.0–1.2)
Total Protein: 7.1 g/dL (ref 6.5–8.1)

## 2024-11-20 MED ORDER — ACETAMINOPHEN 500 MG PO TABS
1000.0000 mg | ORAL_TABLET | Freq: Once | ORAL | Status: DC
  Filled 2024-11-20: qty 2

## 2024-11-20 MED ORDER — FLUORESCEIN SODIUM 1 MG OP STRP
1.0000 | ORAL_STRIP | Freq: Once | OPHTHALMIC | Status: DC
  Filled 2024-11-20: qty 1

## 2024-11-20 MED ORDER — TETRACAINE HCL 0.5 % OP SOLN
2.0000 [drp] | Freq: Once | OPHTHALMIC | Status: DC
  Filled 2024-11-20: qty 4

## 2024-11-20 NOTE — ED Notes (Signed)
 Pt refusing to do a visual acuity screening at this time. Pt states he is miserable and can't even open his eyes.

## 2024-11-20 NOTE — ED Triage Notes (Signed)
 Pt has been here 3 times and to his clinic 2 times for possible poison ivy rash. Pt reports the rash is worsening despite cream, steroids, and hydroxyzine . Pt reports his eyes became affected and his clinic gave him polymyxin eye drops. Pt reports he woke up this morning with Miguel Rasmussen discharge from his eyes and blurred vision. Sclera are red. Pt has had IV medications that have not improved his sx.

## 2024-11-20 NOTE — ED Provider Notes (Signed)
 "  [facility] Provider Note    Event Date/Time   First MD Initiated Contact with Patient 11/20/24 1432     (approximate)   History   Eye Problem  Pt has been here 3 times and to his clinic 2 times for possible poison ivy rash. Pt reports the rash is worsening despite cream, steroids, and hydroxyzine . Pt reports his eyes became affected and his clinic gave him polymyxin eye drops. Pt reports he woke up this morning with green discharge from his eyes and blurred vision. Sclera are red. Pt has had IV medications that have not improved his sx.    HPI Miguel Rasmussen is a 64 y.o. male PMH diabetes, hepatitis C, lichen simplex chronicus presents for evaluation of rash, eye irritation - Patient presents stating he has had several weeks of bilateral hip rash and some mild facial redness and bilateral eye discharge.  Notes his symptoms have been waxing and waning despite multiple medications at recent visits.  Has been to the emergency department twice and reportedly to clinic where did not have notes available from clinic. - No fever, no medication changes other than those that were recently prescribed - Patient is very upset that his symptoms have not yet resolved and is requesting to be cured before leaving the hospital, somewhat agitated on initial eval  Per chart review, patient was last seen in our emergency department on 11/14/2024.  Was reportedly seen for similar on 10/28/2024.  Labs reassuring.  Prescribed oral prednisone  in December and clobetasol  ointment, triamcinolone  cream, hydroxyzine  at recent visit.  Per chart review, has also been seen by dermatology in March 2024.  Thought to have hand dermatitis secondary to recent drug reaction.  Started on topical clobetasol  ointment.     Physical Exam   Triage Vital Signs: ED Triage Vitals  Encounter Vitals Group     BP 11/20/24 1241 134/77     Girls Systolic BP Percentile --      Girls Diastolic BP Percentile --       Boys Systolic BP Percentile --      Boys Diastolic BP Percentile --      Pulse Rate 11/20/24 1241 75     Resp 11/20/24 1241 19     Temp 11/20/24 1241 98.2 F (36.8 C)     Temp Source 11/20/24 1241 Oral     SpO2 11/20/24 1241 100 %     Weight 11/20/24 1242 141 lb 1.5 oz (64 kg)     Height 11/20/24 1242 5' 8 (1.727 m)     Head Circumference --      Peak Flow --      Pain Score 11/20/24 1241 10     Pain Loc --      Pain Education --      Exclude from Growth Chart --     Most recent vital signs: Vitals:   11/20/24 1241  BP: 134/77  Pulse: 75  Resp: 19  Temp: 98.2 F (36.8 C)  SpO2: 100%     General: Awake, no distress.  HEENT: Very mild periorbital swelling and erythema bilaterally.  Patient not able to tolerate eye exam--will try again after tetracaine .  Mild conjunctival injection bilaterally, PERRL, EOMI. CV:  Good peripheral perfusion.  Resp:  Normal effort.  Abd:  No distention.  Other:  Scabbed rash that appears to have had old dried vesicles on bilateral hips.  Nontender, no significant erythema.   ED Results / Procedures / Treatments  Labs (all labs ordered are listed, but only abnormal results are displayed) Labs Reviewed  CBC WITH DIFFERENTIAL/PLATELET - Abnormal; Notable for the following components:      Result Value   RBC 4.10 (*)    MCV 106.8 (*)    Platelets 126 (*)    All other components within normal limits  COMPREHENSIVE METABOLIC PANEL WITH GFR - Abnormal; Notable for the following components:   AST 51 (*)    ALT 56 (*)    All other components within normal limits     EKG  N/a   RADIOLOGY N/a    PROCEDURES:  Critical Care performed: No  Procedures   MEDICATIONS ORDERED IN ED: Medications - No data to display    IMPRESSION / MDM / ASSESSMENT AND PLAN / ED COURSE  I reviewed the triage vital signs and the nursing notes.                              DDX/MDM/AP: Differential diagnosis includes, but is not limited  to, ongoing dermatitis, consider recurrent allergic exposures, no evidence of anaphylaxis at this time.  History and presentation overall not consistent with drug reaction, shingles.  No clear evidence of systemic infection at this time.  Plan: - Labs - tylenol  - Visual acuity - Reassess  Patient's presentation is most consistent with acute complicated illness / injury requiring diagnostic workup.  ED course below.   Clinical Course as of 11/21/24 0013  [place] Nov 20, 2024  1611 Patient refusing Tylenol  and visual acuity.  Aggressive with staff. [MM]  1712 Patient became increasingly upset and requesting to leave emergency department.  Does have decision-making capacity on my eval, able to express plan for self-care.  Not amenable to staying for an eye exam.  Tells me he is going to go to [facility] emergency department instead and would like to be discharged.  Discharged per patient request.  No clear evidence of acute pathology, well-appearing with stable vital signs, currently chronic symptoms.  Had already recommended he follow-up with his primary care provider and dermatologist in addition.  Unable to complete eye exam here. [MM]    Clinical Course User Index [MM] Clarine Ozell LABOR, MD     FINAL CLINICAL IMPRESSION(S) / ED DIAGNOSES   Final diagnoses:  Rash  Eye irritation     Rx / DC Orders   ED Discharge Orders     None        Note:  This document was prepared using Dragon voice recognition software and may include unintentional dictation errors.   Clarine Ozell LABOR, MD 11/21/24 0013  "

## 2024-11-21 ENCOUNTER — Encounter (HOSPITAL_COMMUNITY): Payer: Self-pay | Admitting: Emergency Medicine

## 2024-11-21 ENCOUNTER — Emergency Department (HOSPITAL_COMMUNITY)
Admission: EM | Admit: 2024-11-21 | Discharge: 2024-11-21 | Disposition: A | Discharge status: Home / Self Care | Attending: Emergency Medicine | Admitting: Emergency Medicine

## 2024-11-21 DIAGNOSIS — H1033 Unspecified acute conjunctivitis, bilateral: Secondary | ICD-10-CM | POA: Diagnosis not present

## 2024-11-21 DIAGNOSIS — R21 Rash and other nonspecific skin eruption: Secondary | ICD-10-CM | POA: Diagnosis present

## 2024-11-21 MED ORDER — LIDOCAINE-EPINEPHRINE-TETRACAINE (LET) TOPICAL GEL
3.0000 mL | Freq: Once | TOPICAL | Status: DC
  Filled 2024-11-21: qty 3

## 2024-11-21 MED ORDER — OFLOXACIN 0.3 % OP SOLN
1.0000 [drp] | Freq: Four times a day (QID) | OPHTHALMIC | Status: AC
  Administered 2024-11-21: 1 [drp] via OPHTHALMIC
  Filled 2024-11-21: qty 5

## 2024-11-21 MED ORDER — TETRACAINE HCL 0.5 % OP SOLN
1.0000 [drp] | Freq: Once | OPHTHALMIC | Status: AC
  Administered 2024-11-21: 1 [drp] via OPHTHALMIC
  Filled 2024-11-21: qty 4

## 2024-11-21 MED ORDER — FLUORESCEIN SODIUM 1 MG OP STRP
2.0000 | ORAL_STRIP | Freq: Once | OPHTHALMIC | Status: AC
  Administered 2024-11-21: 2 via OPHTHALMIC
  Filled 2024-11-21: qty 2

## 2024-11-21 NOTE — ED Triage Notes (Signed)
 Patient coming to ED for evaluation of bilateral eye swelling, drainage, and irritation.  Reports symptoms started 3 weeks ago.   Has been seen for same several times and give prescription medication without improvement in symptoms.  States he thought symptoms started as an allergic reaction to poison ivy.  Does have rash on upper thighs and buttocks per patient report

## 2024-11-21 NOTE — ED Notes (Signed)
" °  Visual Acuity:  Left: 10/32 Right: 10/25 Both: 10/25 "

## 2024-11-21 NOTE — Discharge Instructions (Addendum)
 Discontinue Polytrim eye drops  Instead, use Ofloxacin  eye drops as follows: Day 1: Put 1 drop in each eye every 4 hours Day 2: Put 1 drop in each eye every 4 hours Day 3-7: Put 1 drop in each eye every 6 hours.  Follow up with Dr. Austin in the office on Monday; call to schedule an appointment.

## 2024-11-21 NOTE — ED Provider Notes (Signed)
 " [facility] Provider Note   CSN: [id number] Arrival date & time: 11/21/24  9475     Patient presents with: Rash, Miguel Rasmussen, Miguel Rasmussen is a 64 y.o. male.   64 year old male presents to the emergency department for evaluation of Miguel pain.  He states, my eyes feel like rocks.  He has had bilateral erythema, discharge x 1 week.  Describes a burning discomfort to his eyes.  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  He was put on Polymyxin Miguel drops by his clinic mid week. He has been using these with no relief.  Has tried warm compresses without improvement.  States that symptoms began in both eyes with symmetric worsening.  No history of Miguel trauma, vision changes or loss, fever, contact with persons with similar issue.  Has been using Atarax  as well as a steroid taper over the past few weeks for a skin rash which hasn't been secondarily helping his Miguel complaints.  The history is provided by the patient. No language interpreter was used.  Rash Miguel Problem      Prior to Admission medications  Medication Sig Start Date End Date Taking? Authorizing Provider  clobetasol  ointment (TEMOVATE ) 0.05 % Apply 1 Application topically as directed. Qd to bid to aa hands Miguel feet for 2 weeks then d/c, if after 2 weeks rash not resolved can start the TMC cream, avoid face, groin, axilla 11/14/24   Triplett, Cari B, FNP  famotidine  (PEPCID ) 20 MG tablet Take 1 tablet (20 mg total) by mouth 2 (two) times daily. Patient not taking: Reported on 01/28/2023 08/17/19   Sung, Jade J, MD  HYDROcodone -acetaminophen  (NORCO/VICODIN) 5-325 MG tablet Take 1 tablet by mouth every 4 (four) hours as needed. Patient not taking: Reported on 08/02/2017 07/11/17   Dorothyann Drivers, MD  hydrOXYzine  (ATARAX ) 25 MG tablet Take 1 tablet (25 mg total) by mouth 3 (three) times daily as  needed. 11/14/24   Triplett, Kirk B, FNP  ibuprofen (ADVIL,MOTRIN) 200 MG tablet Take 200 mg by mouth every 6 (six) hours as needed. Patient not taking: Reported on 01/28/2023    [provider]  Ledipasvir-Sofosbuvir (HARVONI) 90-400 MG TABS Take by mouth.    [provider]  naproxen  (NAPROSYN ) 250 MG tablet Take 2 tablets (500 mg total) by mouth 2 (two) times daily with a meal. Patient not taking: Reported on 01/28/2023 12/06/22   Viviann Pastor, MD  neomycin -bacitracin -polymyxin (NEOSPORIN) ointment Apply 1 application topically every 12 (twelve) hours. Use for 7 days, apply to wound on left lower leg. Patient not taking: Reported on 01/28/2023 05/23/21   Dicky Anes, MD  triamcinolone  cream (KENALOG ) 0.1 % Apply 1 Application topically as directed. Qd to bid aa body until itching/rash resolved, then prn flares, avoid face, groin, axilla 11/14/24   Triplett, Cari B, FNP    Allergies: Bactrim [sulfamethoxazole-trimethoprim], Cephalexin, Miguel Sulfa antibiotics    Review of Systems  Skin:  Positive for rash.  Ten systems reviewed Miguel are negative for acute change, except as noted in the HPI.    Updated Vital Signs BP (!) 154/84 (BP Location: Left Arm)   Pulse 77   Temp 98 F (36.7 C) (Oral)   Resp 17   Ht 5' 8 (1.727 m)   Wt 64 kg   SpO2 100%   BMI 21.44 kg/m   Physical Exam Vitals Miguel nursing  note reviewed.  Constitutional:      General: He is not in acute distress.    Appearance: He is well-developed. He is not diaphoretic.     Comments: Nontoxic appearing male, in NAD  HENT:     Head: Normocephalic Miguel atraumatic.  Eyes:     General: No scleral icterus.       Right Miguel: Discharge present.        Left Miguel: Discharge present.    Extraocular Movements: Extraocular movements intact.     Pupils: Pupils are equal, round, Miguel reactive to light.     Comments: Snellen 10/32 OS, 10/25 OD, 10/25 OU. Conjunctival injection bilaterally with associated discharge, mild  blepharitis.  There is no significant periorbital edema or erythema.  No proptosis or hyphema.  Full EOMs.  Pupils equal round Miguel reactive to light.  No marked direct photophobia; no consensual photophobia.  No uptake on fluorescein  staining.  No corneal abrasion, ulcer.  Negative Seidel sign.  Intraocular pressure 14 OS w/95% CI Miguel 23 OD w/95% CI.  Pulmonary:     Effort: Pulmonary effort is normal. No respiratory distress.     Comments: Respirations even Miguel unlabored Musculoskeletal:        General: Normal range of motion.     Cervical back: Normal range of motion.  Skin:    General: Skin is warm Miguel dry.     Coloration: Skin is not pale.     Findings: No erythema or rash.  Neurological:     Mental Status: He is alert Miguel oriented to person, place, Miguel time.  Psychiatric:        Behavior: Behavior normal.      (all labs ordered are listed, but only abnormal results are displayed) Labs Reviewed - No data to display  EKG: None  Radiology: No results found.   Procedures   Medications Ordered in the ED  fluorescein  ophthalmic strip 2 strip (2 strips Both Eyes Given 11/21/24 0724)  tetracaine  (PONTOCAINE) 0.5 % ophthalmic solution 1 drop (1 drop Both Eyes Given 11/21/24 0724)  ofloxacin  (OCUFLOX ) 0.3 % ophthalmic solution 1 drop (1 drop Both Eyes Given 11/21/24 0856)    Clinical Course as of 11/21/24 0940  Sat Nov 21, 2024  0845 Spoke with Dr. Austin of ophthalmology who agrees with changing antibiotic to ofloxacin  ophthalmic drops.  Does not recommend any additional emergent intervention.  Is happy to see the patient in clinic on Monday.  Will provide phone number Miguel office address to the patient. [KH]  0900 Discussed plan with patient who verbalizes understanding. All questions answered. Stressed need for f/u with Ophthalmology in clinic if symptoms persist. [KH]    Clinical Course User Index [KH] Keith Sor, PA-C                                 Medical Decision  Making Risk Prescription drug management.   This patient presents to the ED for concern of Miguel redness Miguel discharge, this involves an extensive number of treatment options, Miguel is a complaint that carries with it a high risk of complications Miguel morbidity.  The differential diagnosis includes viral conjunctivitis vs bacterial conjunctivitis vs chemical conjunctivitis vs corneal abrasion vs corneal ulcer vs acute glaucoma vs preseptal cellulitis vs orbital cellulitis vs    Co morbidities that complicate the patient evaluation  LSC   Additional history obtained:  Additional history obtained from medical records External  records from outside source obtained Miguel reviewed including dermatology visit in 2024 for [facility]. Also reviewed note from ED visit yesterday.   Cardiac Monitoring:  The patient was maintained on a cardiac monitor.  I personally viewed Miguel interpreted the cardiac monitored which showed an underlying rhythm of: NSR   Medicines ordered Miguel prescription drug management:  I ordered medication including Tetracain Miguel drops for pain for Ofloxacin  Miguel drops for coverage of bacterial causes  Reevaluation of the patient after these medicines showed that the patient remained stable I have reviewed the patients home medicines Miguel have made adjustments as needed   Test Considered:  CT orbits - felt low yield; no current clinical concern for orbital cellulitis.    Problem List / ED Course:  As above No concern for acute glaucoma given reassuring Miguel pressures.  The patient does not have any history of trauma.  No direct or consensual photophobia.  Doubt uveitis/iritis.   No evidence of corneal ulcer or other defect on fluorescein  staining.  No dendritic staining noted.  Negative Seidel sign, excluding concern for globe rupture.   There is no proptosis Miguel, given bilateral nature of symptoms, retrobulbar abscess unlikely.  Patient has full EOMs, no pain with Miguel movement.  No  periorbital erythema or edema.  Doubt orbital cellulitis. Patient's clinical exam is consistent with suspected bacterial conjunctivitis.  I have explained to the patient that we are not typically able to isolate what bacteria is specifically causing this issue.  I have also informed the patient that viral conjunctivitis is a possibility Miguel these symptoms are usually self-limiting. He has been on a course of polymyxin eyedrops.  Will change this to ofloxacin .  Dr. Austin of ophthalmology is in agreement with this plan.  She does not advise any further emergent intervention currently.   Reevaluation:  After the interventions noted above, I reevaluated the patient Miguel found that they have :stayed the same   Social Determinants of Health:  Transportation issues   Dispostion:  After consideration of the diagnostic results Miguel the patients response to treatment, I feel that the patent would benefit from initiation of Ofloxacin  drops for symptoms. Stressed importance of ophthalmology f/u in clinic on Monday; referral given. Return precautions discussed Miguel provided. Patient discharged in stable condition with no unaddressed concerns.        Final diagnoses:  Acute conjunctivitis of both eyes, unspecified acute conjunctivitis type    ED Discharge Orders     None          Keith Sor, PA-C 11/21/24 9052  "
